# Patient Record
Sex: Male | Born: 1945 | Race: White | Hispanic: No | Marital: Single | Smoking: Never smoker
Health system: Southern US, Community
[De-identification: ages and names within clinical notes are randomized; demographics above are authoritative.]

---

## 2019-02-18 ENCOUNTER — Inpatient Hospital Stay (HOSPITAL_COMMUNITY)
Admission: EM | Admit: 2019-02-18 | Discharge: 2019-03-27 | DRG: 082 | Disposition: E | Payer: Medicare HMO | Attending: General Surgery | Admitting: General Surgery

## 2019-02-18 DIAGNOSIS — J9690 Respiratory failure, unspecified, unspecified whether with hypoxia or hypercapnia: Secondary | ICD-10-CM

## 2019-02-18 DIAGNOSIS — S065XAA Traumatic subdural hemorrhage with loss of consciousness status unknown, initial encounter: Secondary | ICD-10-CM

## 2019-02-18 DIAGNOSIS — S020XXA Fracture of vault of skull, initial encounter for closed fracture: Secondary | ICD-10-CM | POA: Diagnosis not present

## 2019-02-18 DIAGNOSIS — R402123 Coma scale, eyes open, to pain, at hospital admission: Secondary | ICD-10-CM | POA: Diagnosis present

## 2019-02-18 DIAGNOSIS — R64 Cachexia: Secondary | ICD-10-CM | POA: Diagnosis not present

## 2019-02-18 DIAGNOSIS — Z6822 Body mass index (BMI) 22.0-22.9, adult: Secondary | ICD-10-CM

## 2019-02-18 DIAGNOSIS — R402353 Coma scale, best motor response, localizes pain, at hospital admission: Secondary | ICD-10-CM | POA: Diagnosis present

## 2019-02-18 DIAGNOSIS — R000 Tachycardia, unspecified: Secondary | ICD-10-CM | POA: Diagnosis not present

## 2019-02-18 DIAGNOSIS — Z1159 Encounter for screening for other viral diseases: Secondary | ICD-10-CM | POA: Diagnosis not present

## 2019-02-18 DIAGNOSIS — J9601 Acute respiratory failure with hypoxia: Secondary | ICD-10-CM | POA: Diagnosis not present

## 2019-02-18 DIAGNOSIS — B953 Streptococcus pneumoniae as the cause of diseases classified elsewhere: Secondary | ICD-10-CM | POA: Diagnosis not present

## 2019-02-18 DIAGNOSIS — Z515 Encounter for palliative care: Secondary | ICD-10-CM

## 2019-02-18 DIAGNOSIS — I6389 Other cerebral infarction: Secondary | ICD-10-CM | POA: Diagnosis not present

## 2019-02-18 DIAGNOSIS — S066X9A Traumatic subarachnoid hemorrhage with loss of consciousness of unspecified duration, initial encounter: Secondary | ICD-10-CM | POA: Diagnosis not present

## 2019-02-18 DIAGNOSIS — S065X9A Traumatic subdural hemorrhage with loss of consciousness of unspecified duration, initial encounter: Principal | ICD-10-CM | POA: Diagnosis present

## 2019-02-18 DIAGNOSIS — I959 Hypotension, unspecified: Secondary | ICD-10-CM | POA: Diagnosis not present

## 2019-02-18 DIAGNOSIS — R739 Hyperglycemia, unspecified: Secondary | ICD-10-CM | POA: Diagnosis not present

## 2019-02-18 DIAGNOSIS — H1133 Conjunctival hemorrhage, bilateral: Secondary | ICD-10-CM | POA: Diagnosis not present

## 2019-02-18 DIAGNOSIS — L8990 Pressure ulcer of unspecified site, unspecified stage: Secondary | ICD-10-CM

## 2019-02-18 DIAGNOSIS — J13 Pneumonia due to Streptococcus pneumoniae: Secondary | ICD-10-CM | POA: Diagnosis not present

## 2019-02-18 DIAGNOSIS — D62 Acute posthemorrhagic anemia: Secondary | ICD-10-CM | POA: Diagnosis not present

## 2019-02-18 DIAGNOSIS — R451 Restlessness and agitation: Secondary | ICD-10-CM

## 2019-02-18 DIAGNOSIS — G9389 Other specified disorders of brain: Secondary | ICD-10-CM | POA: Diagnosis not present

## 2019-02-18 DIAGNOSIS — R402213 Coma scale, best verbal response, none, at hospital admission: Secondary | ICD-10-CM | POA: Diagnosis not present

## 2019-02-18 DIAGNOSIS — Z66 Do not resuscitate: Secondary | ICD-10-CM

## 2019-02-18 DIAGNOSIS — L89891 Pressure ulcer of other site, stage 1: Secondary | ICD-10-CM | POA: Diagnosis not present

## 2019-02-18 DIAGNOSIS — R0600 Dyspnea, unspecified: Secondary | ICD-10-CM

## 2019-02-18 DIAGNOSIS — J158 Pneumonia due to other specified bacteria: Secondary | ICD-10-CM | POA: Diagnosis not present

## 2019-02-18 DIAGNOSIS — W19XXXA Unspecified fall, initial encounter: Secondary | ICD-10-CM | POA: Diagnosis present

## 2019-02-18 DIAGNOSIS — J9611 Chronic respiratory failure with hypoxia: Secondary | ICD-10-CM

## 2019-02-18 NOTE — ED Notes (Signed)
Paged neurosurgery to dr.rancour
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
## 2019-02-19 ENCOUNTER — Inpatient Hospital Stay (HOSPITAL_COMMUNITY): Payer: Medicare HMO

## 2019-02-19 ENCOUNTER — Emergency Department (HOSPITAL_COMMUNITY): Payer: Medicare HMO

## 2019-02-19 DIAGNOSIS — J9601 Acute respiratory failure with hypoxia: Secondary | ICD-10-CM | POA: Diagnosis present

## 2019-02-19 DIAGNOSIS — S066X9A Traumatic subarachnoid hemorrhage with loss of consciousness of unspecified duration, initial encounter: Secondary | ICD-10-CM | POA: Diagnosis present

## 2019-02-19 DIAGNOSIS — I959 Hypotension, unspecified: Secondary | ICD-10-CM | POA: Diagnosis not present

## 2019-02-19 DIAGNOSIS — Z1159 Encounter for screening for other viral diseases: Secondary | ICD-10-CM | POA: Diagnosis not present

## 2019-02-19 DIAGNOSIS — R64 Cachexia: Secondary | ICD-10-CM | POA: Diagnosis present

## 2019-02-19 DIAGNOSIS — S065XAA Traumatic subdural hemorrhage with loss of consciousness status unknown, initial encounter: Secondary | ICD-10-CM

## 2019-02-19 DIAGNOSIS — Z515 Encounter for palliative care: Secondary | ICD-10-CM | POA: Diagnosis not present

## 2019-02-19 DIAGNOSIS — R0600 Dyspnea, unspecified: Secondary | ICD-10-CM | POA: Diagnosis not present

## 2019-02-19 DIAGNOSIS — I6389 Other cerebral infarction: Secondary | ICD-10-CM | POA: Diagnosis not present

## 2019-02-19 DIAGNOSIS — R402123 Coma scale, eyes open, to pain, at hospital admission: Secondary | ICD-10-CM | POA: Diagnosis present

## 2019-02-19 DIAGNOSIS — Z66 Do not resuscitate: Secondary | ICD-10-CM | POA: Diagnosis not present

## 2019-02-19 DIAGNOSIS — H1133 Conjunctival hemorrhage, bilateral: Secondary | ICD-10-CM | POA: Diagnosis present

## 2019-02-19 DIAGNOSIS — R402353 Coma scale, best motor response, localizes pain, at hospital admission: Secondary | ICD-10-CM | POA: Diagnosis present

## 2019-02-19 DIAGNOSIS — S065X9A Traumatic subdural hemorrhage with loss of consciousness of unspecified duration, initial encounter: Secondary | ICD-10-CM

## 2019-02-19 DIAGNOSIS — R739 Hyperglycemia, unspecified: Secondary | ICD-10-CM | POA: Diagnosis not present

## 2019-02-19 DIAGNOSIS — J9611 Chronic respiratory failure with hypoxia: Secondary | ICD-10-CM | POA: Diagnosis not present

## 2019-02-19 DIAGNOSIS — B953 Streptococcus pneumoniae as the cause of diseases classified elsewhere: Secondary | ICD-10-CM | POA: Diagnosis present

## 2019-02-19 DIAGNOSIS — J158 Pneumonia due to other specified bacteria: Secondary | ICD-10-CM | POA: Diagnosis not present

## 2019-02-19 DIAGNOSIS — R402213 Coma scale, best verbal response, none, at hospital admission: Secondary | ICD-10-CM | POA: Diagnosis present

## 2019-02-19 DIAGNOSIS — D62 Acute posthemorrhagic anemia: Secondary | ICD-10-CM | POA: Diagnosis not present

## 2019-02-19 DIAGNOSIS — W19XXXA Unspecified fall, initial encounter: Secondary | ICD-10-CM | POA: Diagnosis present

## 2019-02-19 DIAGNOSIS — L89891 Pressure ulcer of other site, stage 1: Secondary | ICD-10-CM | POA: Diagnosis not present

## 2019-02-19 DIAGNOSIS — J13 Pneumonia due to Streptococcus pneumoniae: Secondary | ICD-10-CM | POA: Diagnosis not present

## 2019-02-19 DIAGNOSIS — R000 Tachycardia, unspecified: Secondary | ICD-10-CM | POA: Diagnosis not present

## 2019-02-19 DIAGNOSIS — S020XXA Fracture of vault of skull, initial encounter for closed fracture: Secondary | ICD-10-CM | POA: Diagnosis present

## 2019-02-19 DIAGNOSIS — G9389 Other specified disorders of brain: Secondary | ICD-10-CM | POA: Diagnosis present

## 2019-02-19 DIAGNOSIS — Z6822 Body mass index (BMI) 22.0-22.9, adult: Secondary | ICD-10-CM | POA: Diagnosis not present

## 2019-02-19 LAB — POCT I-STAT 7, (LYTES, BLD GAS, ICA,H+H)
Acid-base deficit: 1 mmol/L (ref 0.0–2.0)
Acid-base deficit: 2 mmol/L (ref 0.0–2.0)
Bicarbonate: 22.9 mmol/L (ref 20.0–28.0)
Bicarbonate: 24.3 mmol/L (ref 20.0–28.0)
Calcium, Ion: 1.17 mmol/L (ref 1.15–1.40)
Calcium, Ion: 1.18 mmol/L (ref 1.15–1.40)
HCT: 34 % — ABNORMAL LOW (ref 39.0–52.0)
HCT: 37 % — ABNORMAL LOW (ref 39.0–52.0)
Hemoglobin: 11.6 g/dL — ABNORMAL LOW (ref 13.0–17.0)
Hemoglobin: 12.6 g/dL — ABNORMAL LOW (ref 13.0–17.0)
O2 Saturation: 99 %
O2 Saturation: 99 %
Patient temperature: 96.3
Patient temperature: 97.6
Potassium: 3.6 mmol/L (ref 3.5–5.1)
Potassium: 3.9 mmol/L (ref 3.5–5.1)
Sodium: 138 mmol/L (ref 135–145)
Sodium: 140 mmol/L (ref 135–145)
TCO2: 24 mmol/L (ref 22–32)
TCO2: 26 mmol/L (ref 22–32)
pCO2 arterial: 35.9 mmHg (ref 32.0–48.0)
pCO2 arterial: 39.1 mmHg (ref 32.0–48.0)
pH, Arterial: 7.4 (ref 7.350–7.450)
pH, Arterial: 7.407 (ref 7.350–7.450)
pO2, Arterial: 132 mmHg — ABNORMAL HIGH (ref 83.0–108.0)
pO2, Arterial: 143 mmHg — ABNORMAL HIGH (ref 83.0–108.0)

## 2019-02-19 LAB — CBC WITH DIFFERENTIAL/PLATELET
Abs Immature Granulocytes: 0.12 10*3/uL — ABNORMAL HIGH (ref 0.00–0.07)
Basophils Absolute: 0 10*3/uL (ref 0.0–0.1)
Basophils Relative: 0 %
Eosinophils Absolute: 0 10*3/uL (ref 0.0–0.5)
Eosinophils Relative: 0 %
HCT: 40.1 % (ref 39.0–52.0)
Hemoglobin: 13.2 g/dL (ref 13.0–17.0)
Immature Granulocytes: 1 %
Lymphocytes Relative: 7 %
Lymphs Abs: 1.2 10*3/uL (ref 0.7–4.0)
MCH: 28.4 pg (ref 26.0–34.0)
MCHC: 32.9 g/dL (ref 30.0–36.0)
MCV: 86.4 fL (ref 80.0–100.0)
Monocytes Absolute: 1 10*3/uL (ref 0.1–1.0)
Monocytes Relative: 6 %
Neutro Abs: 15 10*3/uL — ABNORMAL HIGH (ref 1.7–7.7)
Neutrophils Relative %: 86 %
Platelets: 214 10*3/uL (ref 150–400)
RBC: 4.64 MIL/uL (ref 4.22–5.81)
RDW: 13 % (ref 11.5–15.5)
WBC: 17.2 10*3/uL — ABNORMAL HIGH (ref 4.0–10.5)
nRBC: 0 % (ref 0.0–0.2)

## 2019-02-19 LAB — COMPREHENSIVE METABOLIC PANEL
ALT: 16 U/L (ref 0–44)
AST: 25 U/L (ref 15–41)
Albumin: 3.6 g/dL (ref 3.5–5.0)
Alkaline Phosphatase: 60 U/L (ref 38–126)
Anion gap: 11 (ref 5–15)
BUN: 13 mg/dL (ref 8–23)
CO2: 22 mmol/L (ref 22–32)
Calcium: 8.6 mg/dL — ABNORMAL LOW (ref 8.9–10.3)
Chloride: 106 mmol/L (ref 98–111)
Creatinine, Ser: 0.82 mg/dL (ref 0.61–1.24)
GFR calc Af Amer: >60 mL/min (ref >60)
GFR calc non Af Amer: >60 mL/min (ref >60)
Glucose, Bld: 134 mg/dL — ABNORMAL HIGH (ref 70–99)
Potassium: 3.6 mmol/L (ref 3.5–5.1)
Sodium: 139 mmol/L (ref 135–145)
Total Bilirubin: 0.6 mg/dL (ref 0.3–1.2)
Total Protein: 6.7 g/dL (ref 6.5–8.1)

## 2019-02-19 LAB — PROTIME-INR
INR: 1 (ref 0.8–1.2)
Prothrombin Time: 13.1 seconds (ref 11.4–15.2)

## 2019-02-19 LAB — SARS CORONAVIRUS 2 BY RT PCR (HOSPITAL ORDER, PERFORMED IN ~~LOC~~ HOSPITAL LAB): SARS Coronavirus 2: NEGATIVE

## 2019-02-19 LAB — TRIGLYCERIDES
Triglycerides: 129 mg/dL (ref <150)
Triglycerides: 78 mg/dL (ref <150)

## 2019-02-19 LAB — TYPE AND SCREEN
ABO/RH(D): A POS
Antibody Screen: NEGATIVE

## 2019-02-19 LAB — ABO/RH: ABO/RH(D): A POS

## 2019-02-19 LAB — MRSA PCR SCREENING: MRSA by PCR: NEGATIVE

## 2019-02-19 MED ORDER — FENTANYL 2500MCG IN NS 250ML (10MCG/ML) PREMIX INFUSION
25.0000 ug/h | INTRAVENOUS | Status: DC
  Administered 2019-02-19: 100 ug/h via INTRAVENOUS
  Administered 2019-02-19: 200 ug/h via INTRAVENOUS
  Administered 2019-02-21: 75 ug/h via INTRAVENOUS
  Administered 2019-02-22: 23:00:00 50 ug/h via INTRAVENOUS
  Administered 2019-02-24: 100 ug/h via INTRAVENOUS
  Administered 2019-02-26: 75 ug/h via INTRAVENOUS
  Administered 2019-02-27: 100 ug/h via INTRAVENOUS
  Filled 2019-02-19 (×8): qty 250

## 2019-02-19 MED ORDER — LEVETIRACETAM IN NACL 500 MG/100ML IV SOLN
500.0000 mg | Freq: Two times a day (BID) | INTRAVENOUS | Status: AC
  Administered 2019-02-19 – 2019-02-26 (×16): 500 mg via INTRAVENOUS
  Filled 2019-02-19 (×16): qty 100

## 2019-02-19 MED ORDER — PROPOFOL 1000 MG/100ML IV EMUL: 0.0000 ug/kg/min | INTRAVENOUS | Status: DC

## 2019-02-19 MED ORDER — DOCUSATE SODIUM 50 MG/5ML PO LIQD
100.0000 mg | Freq: Two times a day (BID) | ORAL | Status: DC | PRN
  Administered 2019-02-20 – 2019-02-24 (×5): 100 mg
  Filled 2019-02-19 (×5): qty 10

## 2019-02-19 MED ORDER — POTASSIUM CHLORIDE IN NACL 20-0.9 MEQ/L-% IV SOLN
INTRAVENOUS | Status: DC
  Administered 2019-02-19 – 2019-02-28 (×21): via INTRAVENOUS
  Filled 2019-02-19 (×20): qty 1000

## 2019-02-19 MED ORDER — FENTANYL BOLUS VIA INFUSION
25.0000 ug | INTRAVENOUS | Status: DC | PRN
  Administered 2019-02-22 – 2019-02-24 (×5): 25 ug via INTRAVENOUS
  Filled 2019-02-19: qty 25

## 2019-02-19 MED ORDER — MIDAZOLAM 50MG/50ML (1MG/ML) PREMIX INFUSION: 0.0000 mg/h | INTRAVENOUS | Status: DC

## 2019-02-19 MED ORDER — ORAL CARE MOUTH RINSE
15.0000 mL | OROMUCOSAL | Status: DC
  Administered 2019-02-19 – 2019-03-02 (×110): 15 mL via OROMUCOSAL

## 2019-02-19 MED ORDER — MIDAZOLAM BOLUS VIA INFUSION
1.0000 mg | INTRAVENOUS | Status: DC | PRN
  Filled 2019-02-19: qty 2

## 2019-02-19 MED ORDER — METOPROLOL TARTRATE 5 MG/5ML IV SOLN
5.0000 mg | Freq: Four times a day (QID) | INTRAVENOUS | Status: AC | PRN
  Administered 2019-02-20 – 2019-02-23 (×4): 5 mg via INTRAVENOUS
  Filled 2019-02-19 (×4): qty 5

## 2019-02-19 MED ORDER — PANTOPRAZOLE SODIUM 40 MG IV SOLR
40.0000 mg | INTRAVENOUS | Status: DC
  Administered 2019-02-19 – 2019-02-25 (×7): 40 mg via INTRAVENOUS
  Filled 2019-02-19 (×7): qty 40

## 2019-02-19 MED ORDER — FENTANYL CITRATE (PF) 100 MCG/2ML IJ SOLN: 25.0000 ug | Freq: Once | INTRAMUSCULAR | Status: DC

## 2019-02-19 MED ORDER — CHLORHEXIDINE GLUCONATE 0.12% ORAL RINSE (MEDLINE KIT)
15.0000 mL | Freq: Two times a day (BID) | OROMUCOSAL | Status: DC
  Administered 2019-02-19 – 2019-03-01 (×22): 15 mL via OROMUCOSAL

## 2019-02-19 MED ORDER — PROPOFOL 1000 MG/100ML IV EMUL
0.0000 ug/kg/min | INTRAVENOUS | Status: DC
  Administered 2019-02-19: 10 ug/kg/min via INTRAVENOUS
  Administered 2019-02-20: 25 ug/kg/min via INTRAVENOUS
  Administered 2019-02-20 – 2019-02-21 (×2): 10 ug/kg/min via INTRAVENOUS
  Administered 2019-02-22: 5 ug/kg/min via INTRAVENOUS
  Administered 2019-02-23: 10 ug/kg/min via INTRAVENOUS
  Administered 2019-02-24: 5 ug/kg/min via INTRAVENOUS
  Administered 2019-02-25: 20 ug/kg/min via INTRAVENOUS
  Administered 2019-02-25: 10 ug/kg/min via INTRAVENOUS
  Administered 2019-02-26: 20 ug/kg/min via INTRAVENOUS
  Administered 2019-02-26: 10 ug/kg/min via INTRAVENOUS
  Administered 2019-02-27: 20 ug/kg/min via INTRAVENOUS
  Administered 2019-02-27 – 2019-02-28 (×2): 10 ug/kg/min via INTRAVENOUS
  Filled 2019-02-19 (×14): qty 100
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
## 2019-02-19 NOTE — Progress Notes (Signed)
RT NOTE:  Pt transported to [facility]N by Lowella Bandy, RT. Report given to [facility], RT by this RT.

## 2019-02-19 NOTE — Progress Notes (Signed)
Pt arrived on unit with clothing and a wallet. Wallet contained ID cards/ insurance cards/ and $189. Pt belongs sent down to Security- yellow slip in pt chart.
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
## 2019-02-20 ENCOUNTER — Inpatient Hospital Stay (HOSPITAL_COMMUNITY): Payer: Medicare HMO

## 2019-02-20 LAB — CBC
HCT: 36 % — ABNORMAL LOW (ref 39.0–52.0)
Hemoglobin: 11.6 g/dL — ABNORMAL LOW (ref 13.0–17.0)
MCH: 28.4 pg (ref 26.0–34.0)
MCHC: 32.2 g/dL (ref 30.0–36.0)
MCV: 88.2 fL (ref 80.0–100.0)
Platelets: 174 10*3/uL (ref 150–400)
RBC: 4.08 MIL/uL — ABNORMAL LOW (ref 4.22–5.81)
RDW: 13.5 % (ref 11.5–15.5)
WBC: 11.5 10*3/uL — ABNORMAL HIGH (ref 4.0–10.5)
nRBC: 0 % (ref 0.0–0.2)

## 2019-02-20 LAB — GLUCOSE, CAPILLARY
Glucose-Capillary: 112 mg/dL — ABNORMAL HIGH (ref 70–99)
Glucose-Capillary: 151 mg/dL — ABNORMAL HIGH (ref 70–99)
Glucose-Capillary: 162 mg/dL — ABNORMAL HIGH (ref 70–99)
Glucose-Capillary: 169 mg/dL — ABNORMAL HIGH (ref 70–99)
Glucose-Capillary: 90 mg/dL (ref 70–99)

## 2019-02-20 LAB — BASIC METABOLIC PANEL
Anion gap: 11 (ref 5–15)
BUN: 14 mg/dL (ref 8–23)
CO2: 18 mmol/L — ABNORMAL LOW (ref 22–32)
Calcium: 7.9 mg/dL — ABNORMAL LOW (ref 8.9–10.3)
Chloride: 111 mmol/L (ref 98–111)
Creatinine, Ser: 0.69 mg/dL (ref 0.61–1.24)
GFR calc Af Amer: >60 mL/min (ref >60)
GFR calc non Af Amer: >60 mL/min (ref >60)
Glucose, Bld: 92 mg/dL (ref 70–99)
Potassium: 4 mmol/L (ref 3.5–5.1)
Sodium: 140 mmol/L (ref 135–145)

## 2019-02-20 LAB — MAGNESIUM
Magnesium: 2 mg/dL (ref 1.7–2.4)
Magnesium: 2 mg/dL (ref 1.7–2.4)

## 2019-02-20 LAB — PHOSPHORUS
Phosphorus: 2.5 mg/dL (ref 2.5–4.6)
Phosphorus: 1.7 mg/dL — ABNORMAL LOW (ref 2.5–4.6)

## 2019-02-20 LAB — TRIGLYCERIDES: Triglycerides: 67 mg/dL (ref <150)

## 2019-02-20 MED ORDER — VITAL HIGH PROTEIN PO LIQD: 1000.0000 mL | ORAL | Status: DC

## 2019-02-20 MED ORDER — PRO-STAT SUGAR FREE PO LIQD
30.0000 mL | Freq: Two times a day (BID) | ORAL | Status: DC
  Administered 2019-02-20 – 2019-02-28 (×17): 30 mL
  Filled 2019-02-20 (×17): qty 30

## 2019-02-20 MED ORDER — PIVOT 1.5 CAL PO LIQD
1000.0000 mL | ORAL | Status: DC
  Administered 2019-02-20 – 2019-02-28 (×8): 1000 mL
  Filled 2019-02-20 (×2): qty 1000

## 2019-02-20 MED ORDER — CHLORHEXIDINE GLUCONATE CLOTH 2 % EX PADS
6.0000 | MEDICATED_PAD | Freq: Every day | CUTANEOUS | Status: DC
  Administered 2019-02-21 – 2019-03-05 (×12): 6 via TOPICAL

## 2019-02-20 MED ORDER — SELENIUM 200 MCG PO TABS
200.0000 ug | ORAL_TABLET | Freq: Every day | ORAL | Status: AC
  Administered 2019-02-20 – 2019-02-26 (×7): 200 ug
  Filled 2019-02-20 (×7): qty 1

## 2019-02-20 MED ORDER — VITAMIN C 500 MG PO TABS
1000.0000 mg | ORAL_TABLET | Freq: Three times a day (TID) | ORAL | Status: AC
  Administered 2019-02-20 – 2019-02-26 (×21): 1000 mg
  Filled 2019-02-20 (×21): qty 2

## 2019-02-20 NOTE — Progress Notes (Signed)
After turning pt, SpO2 dropped to 83%, HR 215bpm. OG came out when pt was agitated and moving around in bed. Placed new OG. Dr. Janee Morn called and at bedside to evaluate pt. Increased fentanyl and prop gtt. See assoc orders.

## 2019-02-20 NOTE — Progress Notes (Addendum)
Patient ID: Nektarios Penninger, male   DOB: 20-Jun-1946, 73 y.o.   MRN: [id number] Follow up - Trauma Critical Care  Patient Details:    Dontra Stoneking is an 73 y.o. male.  Lines/tubes : Airway 7.5 mm (Active)  Secured at (cm) 24 cm 02/20/2019  3:14 AM  Measured From Lips 02/20/2019  3:14 AM  Secured Location Center 02/20/2019  3:14 AM  Secured By [employer] 02/20/2019  3:14 AM  Tube Holder Repositioned Yes 02/20/2019  3:14 AM  Cuff Pressure (cm H2O) 28 cm H2O 02/19/2019  7:05 PM  Site Condition Dry 02/19/2019  3:35 PM     NG/OG Tube Orogastric Left mouth (Active)  Cm Marking at Nare/Corner of Mouth (if applicable) 65 cm 02/19/2019  8:00 PM  Site Assessment Clean;Dry;Intact 02/19/2019  8:00 PM  Ongoing Placement Verification No change in cm markings or external length of tube from initial placement;No change in respiratory status;No acute changes, not attributed to clinical condition 02/19/2019  8:00 PM  Status Clamped 02/19/2019  8:00 PM     External Urinary Catheter (Active)  Collection Container Standard drainage bag 02/19/2019  8:00 PM  Securement Method Other (Comment) 02/19/2019  8:00 AM  Output (mL) 0 mL 02/19/2019 10:00 PM    Microbiology/Sepsis markers: Results for orders placed or performed during the hospital encounter of 03-09-19  SARS Coronavirus 2 (CEPHEID- Performed in [facility] hospital lab), Hosp Order     Status: None   Collection Time: 09-Mar-2019 11:57 PM  Result Value Ref Range Status   SARS Coronavirus 2 NEGATIVE NEGATIVE Final    Comment: (NOTE) If result is NEGATIVE SARS-CoV-2 target nucleic acids are NOT DETECTED. The SARS-CoV-2 RNA is generally detectable in upper and lower  respiratory specimens during the acute phase of infection. The lowest  concentration of SARS-CoV-2 viral copies this assay can detect is 250  copies / mL. A negative result does not preclude SARS-CoV-2 infection  and should not be used as the sole basis for treatment or other   patient management decisions.  A negative result may occur with  improper specimen collection / handling, submission of specimen other  than nasopharyngeal swab, presence of viral mutation(s) within the  areas targeted by this assay, and inadequate number of viral copies  (<250 copies / mL). A negative result must be combined with clinical  observations, patient history, and epidemiological information. If result is POSITIVE SARS-CoV-2 target nucleic acids are DETECTED. The SARS-CoV-2 RNA is generally detectable in upper and lower  respiratory specimens dur ing the acute phase of infection.  Positive  results are indicative of active infection with SARS-CoV-2.  Clinical  correlation with patient history and other diagnostic information is  necessary to determine patient infection status.  Positive results do  not rule out bacterial infection or co-infection with other viruses. If result is PRESUMPTIVE POSTIVE SARS-CoV-2 nucleic acids MAY BE PRESENT.   A presumptive positive result was obtained on the submitted specimen  and confirmed on repeat testing.  While 2019 novel coronavirus  (SARS-CoV-2) nucleic acids may be present in the submitted sample  additional confirmatory testing may be necessary for epidemiological  and / or clinical management purposes  to differentiate between  SARS-CoV-2 and other Sarbecovirus currently known to infect humans.  If clinically indicated additional testing with an alternate test  methodology [phone] is advised. The SARS-CoV-2 RNA is generally  detectable in upper and lower respiratory sp ecimens during the acute  phase of infection. The expected result is Negative.  Fact Sheet for Patients:  BoilerBrush.com.cy Fact Sheet for Healthcare Providers: https://pope.com/ This test is not yet approved or cleared by the [place] FDA and has been authorized for detection and/or diagnosis of SARS-CoV-2  by FDA under an Emergency Use Authorization (EUA).  This EUA will remain in effect (meaning this test can be used) for the duration of the COVID-19 declaration under Section 564(b)(1) of the Act, 21 U.S.C. section 360bbb-3(b)(1), unless the authorization is terminated or revoked sooner. Performed at [facility], [address], [place], [place] [address]   MRSA PCR Screening     Status: None   Collection Time: 02/19/19  1:19 AM  Result Value Ref Range Status   MRSA by PCR NEGATIVE NEGATIVE Final    Comment:        The GeneXpert MRSA Assay (FDA approved for NASAL specimens only), is one component of a comprehensive MRSA colonization surveillance program. It is not intended to diagnose MRSA infection nor to guide or monitor treatment for MRSA infections. Performed at [facility], [address], [place], [place] [address]     Anti-infectives:  Anti-infectives (From admission, onward)   None      Best Practice/Protocols:  VTE Prophylaxis: Mechanical Continous Sedation  Consults: Treatment Team:  Jadene Pierini, MD   Subjective:    Overnight Issues:   Objective:  Vital signs for last 24 hours: Temp:  [98.1 F (36.7 C)-98.9 F (37.2 C)] 98.9 F (37.2 C) (05/27 0400) Pulse Rate:  [49-88] 54 (05/27 0700) Resp:  [14-32] 24 (05/27 0700) BP: (91-154)/(46-80) 121/53 (05/27 0700) SpO2:  [96 %-100 %] 99 % (05/27 0700) FiO2 (%):  [35 %-40 %] 40 % (05/27 0700)  Hemodynamic parameters for last 24 hours:    Intake/Output from previous day: 05/26 0701 - 05/27 0700 In: 2972.4 [I.V.:2761.4; IV Piggyback:211] Out: 850 [Urine:850]  Intake/Output this shift: No intake/output data recorded.  Vent settings for last 24 hours: Vent Mode: PRVC FiO2 (%):  [35 %-40 %] 40 % Set Rate:  [14 bmp] 14 bmp Vt Set:  [600 mL] 600 mL PEEP:  [5 cmH20] 5 cmH20 Plateau Pressure:  [15 cmH20-16 cmH20] 16 cmH20  Physical Exam:  General: on vent Neuro: PERL, arouses  and MAE but not F/C HEENT/Neck: ETT and collar Resp: clear to auscultation bilaterally CVS: RRR GI: soft, NT Extremities: no edema  Results for orders placed or performed during the hospital encounter of 2019/02/20 (from the past 24 hour(s))  I-STAT 7, (LYTES, BLD GAS, ICA, H+H)     Status: Abnormal   Collection Time: 02/19/19  8:24 AM  Result Value Ref Range   pH, Arterial 7.400 7.350 - 7.450   pCO2 arterial 39.1 32.0 - 48.0 mmHg   pO2, Arterial 132.0 (H) 83.0 - 108.0 mmHg   Bicarbonate 24.3 20.0 - 28.0 mmol/L   TCO2 26 22 - 32 mmol/L   O2 Saturation 99.0 %   Acid-base deficit 1.0 0.0 - 2.0 mmol/L   Sodium 140 135 - 145 mmol/L   Potassium 3.9 3.5 - 5.1 mmol/L   Calcium, Ion 1.17 1.15 - 1.40 mmol/L   HCT 34.0 (L) 39.0 - 52.0 %   Hemoglobin 11.6 (L) 13.0 - 17.0 g/dL   Patient temperature 09.8 F    Collection site RADIAL, ALLEN'S TEST ACCEPTABLE    Sample type ARTERIAL   Triglycerides     Status: None   Collection Time: 02/20/19  4:31 AM  Result Value Ref Range   Triglycerides  67 <150 mg/dL  CBC     Status: Abnormal   Collection Time: 02/20/19  4:31 AM  Result Value Ref Range   WBC 11.5 (H) 4.0 - 10.5 K/uL   RBC 4.08 (L) 4.22 - 5.81 MIL/uL   Hemoglobin 11.6 (L) 13.0 - 17.0 g/dL   HCT 29.536.0 (L) 62.139.0 - 30.852.0 %   MCV 88.2 80.0 - 100.0 fL   MCH 28.4 26.0 - 34.0 pg   MCHC 32.2 30.0 - 36.0 g/dL   RDW 65.713.5 84.611.5 - 96.215.5 %   Platelets 174 150 - 400 K/uL   nRBC 0.0 0.0 - 0.2 %  Basic metabolic panel     Status: Abnormal   Collection Time: 02/20/19  4:31 AM  Result Value Ref Range   Sodium 140 135 - 145 mmol/L   Potassium 4.0 3.5 - 5.1 mmol/L   Chloride 111 98 - 111 mmol/L   CO2 18 (L) 22 - 32 mmol/L   Glucose, Bld 92 70 - 99 mg/dL   BUN 14 8 - 23 mg/dL   Creatinine, Ser 9.520.69 0.61 - 1.24 mg/dL   Calcium 7.9 (L) 8.9 - 10.3 mg/dL   GFR calc non Af Amer >60 >60 mL/min   GFR calc Af Amer >60 >60 mL/min   Anion gap 11 5 - 15    Assessment & Plan: Present on Admission: . Subdural  hematoma (HCC)    LOS: 1 day   Additional comments:I reviewed the patient's new clinical lab test results. and F/U CT H yesterday Found down TBI/B SDH/SAH/B temporal ICC/parietal skull FX/pneumocephalus - per Dr. Maurice Smallstergard, F/U CT head yesterday was worse including L temporal lobe infarct, Keppra 500mg  Q12 Acute hypoxic ventilator dependent respiratory failure - gas exchange has been OK, start weaning. Will not extubate until MS better. [place] collar for now FEN - start TF, Na OK VTE - PAS Dispo - ICU I will call his daughter this AM Critical Care Total Time*: 4439 Minutes  Violeta GelinasBurke Harace Mccluney, MD, MPH, FACS Trauma & General Surgery: [phone]  02/20/2019  *Care during the described time interval was provided by me. I have reviewed this patient's available data, including medical history, events of note, physical examination and test results as part of my evaluation.
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
## 2019-02-20 NOTE — Progress Notes (Signed)
Initial Nutrition Assessment  DOCUMENTATION CODES:   Not applicable  INTERVENTION:   Pivot 1.5 @ 45 ml/hr 30 ml Prostat BID  Provides: 1640 kcal, 120 grams protein, and 728 ml free water.  TF regimen and propofol at current rate providing 1699 total kcal/day (98 % of kcal needs)   NUTRITION DIAGNOSIS:   Increased nutrient needs related to (TBI) as evidenced by estimated needs.  GOAL:   Patient will meet greater than or equal to 90% of their needs  MONITOR:   Vent status, TF tolerance  REASON FOR ASSESSMENT:   Consult, Ventilator Enteral/tube feeding initiation and management  ASSESSMENT:   Pt found down with TBI/B SDH/SAH/B temporal ICC/parietal skull fx, pneumocephalus.    Per MD head CT worse  Patient is currently intubated on ventilator support MV: 9.5 L/min Temp (24hrs), Avg:98.3 F (36.8 C), Min:97.7 F (36.5 C), Max:98.9 F (37.2 C)  Propofol: 2.25 ml/hr (5 mcg) = 59 kcal  Medications reviewed and include: selenium, 1000 mg vitamin C every 8 hours NS with 20 mEq KCl/L @ 100 ml/hr Labs reviewed Pt is positive 2 L since admission   NUTRITION - FOCUSED PHYSICAL EXAM:  Deferred   Diet Order:   Diet Order            Diet NPO time specified  Diet effective now              EDUCATION NEEDS:   No education needs have been identified at this time  Skin:  Skin Assessment: Reviewed RN Assessment  Last BM:  unknown  Height:   Ht Readings from Last 1 Encounters:  02/19/19 5\' 9"  (1.753 m)    Weight:   Wt Readings from Last 1 Encounters:  02/19/19 75 kg    Ideal Body Weight:  72.7 kg  BMI:  Body mass index is 24.42 kg/m.  Estimated Nutritional Needs:   Kcal:  1731  Protein:  112-125 grams  Fluid:  > 1.7 L/day  Kendell Bane RD, LDN, CNSC [phone] Pager [phone] After Hours Pager

## 2019-02-20 NOTE — Progress Notes (Signed)
Patient ID: Francis Mendoza, male   DOB: Mar 14, 1946, 73 y.o.   MRN: [id number] I spoke with Alanda Amass on the phone. Dellas lives with her and she is his deceased son in law's wife. She is his closest family. I updated he ron his condition and the plan of care.  Violeta Gelinas, MD, MPH, FACS Trauma & General Surgery: [phone]
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
## 2019-02-21 ENCOUNTER — Inpatient Hospital Stay (HOSPITAL_COMMUNITY): Payer: Medicare HMO

## 2019-02-21 LAB — CBC
HCT: 34.8 % — ABNORMAL LOW (ref 39.0–52.0)
Hemoglobin: 11.3 g/dL — ABNORMAL LOW (ref 13.0–17.0)
MCH: 28.4 pg (ref 26.0–34.0)
MCHC: 32.5 g/dL (ref 30.0–36.0)
MCV: 87.4 fL (ref 80.0–100.0)
Platelets: 191 10*3/uL (ref 150–400)
RBC: 3.98 MIL/uL — ABNORMAL LOW (ref 4.22–5.81)
RDW: 13.7 % (ref 11.5–15.5)
WBC: 15.1 10*3/uL — ABNORMAL HIGH (ref 4.0–10.5)
nRBC: 0 % (ref 0.0–0.2)

## 2019-02-21 LAB — GLUCOSE, CAPILLARY
Glucose-Capillary: 166 mg/dL — ABNORMAL HIGH (ref 70–99)
Glucose-Capillary: 148 mg/dL — ABNORMAL HIGH (ref 70–99)
Glucose-Capillary: 145 mg/dL — ABNORMAL HIGH (ref 70–99)
Glucose-Capillary: 159 mg/dL — ABNORMAL HIGH (ref 70–99)
Glucose-Capillary: 118 mg/dL — ABNORMAL HIGH (ref 70–99)
Glucose-Capillary: 132 mg/dL — ABNORMAL HIGH (ref 70–99)

## 2019-02-21 LAB — BASIC METABOLIC PANEL
Anion gap: 4 — ABNORMAL LOW (ref 5–15)
BUN: 24 mg/dL — ABNORMAL HIGH (ref 8–23)
CO2: 26 mmol/L (ref 22–32)
Calcium: 7.8 mg/dL — ABNORMAL LOW (ref 8.9–10.3)
Chloride: 112 mmol/L — ABNORMAL HIGH (ref 98–111)
Creatinine, Ser: 0.72 mg/dL (ref 0.61–1.24)
GFR calc Af Amer: >60 mL/min (ref >60)
GFR calc non Af Amer: >60 mL/min (ref >60)
Glucose, Bld: 165 mg/dL — ABNORMAL HIGH (ref 70–99)
Potassium: 4.1 mmol/L (ref 3.5–5.1)
Sodium: 142 mmol/L (ref 135–145)

## 2019-02-21 LAB — MAGNESIUM
Magnesium: 2.2 mg/dL (ref 1.7–2.4)
Magnesium: 2 mg/dL (ref 1.7–2.4)

## 2019-02-21 LAB — PHOSPHORUS
Phosphorus: 1 mg/dL — CL (ref 2.5–4.6)
Phosphorus: 1.5 mg/dL — ABNORMAL LOW (ref 2.5–4.6)

## 2019-02-21 LAB — TRIGLYCERIDES: Triglycerides: 76 mg/dL (ref <150)

## 2019-02-21 MED ORDER — INSULIN ASPART 100 UNIT/ML ~~LOC~~ SOLN
0.0000 [IU] | SUBCUTANEOUS | Status: DC
  Administered 2019-02-21: 2 [IU] via SUBCUTANEOUS
  Administered 2019-02-21: 3 [IU] via SUBCUTANEOUS
  Administered 2019-02-21 – 2019-02-24 (×16): 2 [IU] via SUBCUTANEOUS
  Administered 2019-02-24: 16:00:00 3 [IU] via SUBCUTANEOUS
  Administered 2019-02-24 – 2019-02-25 (×6): 2 [IU] via SUBCUTANEOUS
  Administered 2019-02-26: 3 [IU] via SUBCUTANEOUS
  Administered 2019-02-26 – 2019-03-04 (×16): 2 [IU] via SUBCUTANEOUS

## 2019-02-21 MED ORDER — SODIUM PHOSPHATES 45 MMOLE/15ML IV SOLN
20.0000 mmol | Freq: Once | INTRAVENOUS | Status: AC
  Administered 2019-02-21: 12:00:00 20 mmol via INTRAVENOUS
  Filled 2019-02-21: qty 6.67

## 2019-02-21 NOTE — Progress Notes (Signed)
Patient ID: Francis HousemanRalph Mendoza, male   DOB: [birth year], 73 y.o.   MRN: [id number] Follow up - Trauma Critical Care  Patient Details:    Francis HousemanRalph Mendoza is an 73 y.o. male.  Lines/tubes : Airway 7.5 mm (Active)  Secured at (cm) 24 cm 02/21/2019  4:10 AM  Measured From Lips 02/21/2019  4:10 AM  Secured Location Left 02/21/2019 12:15 AM  Secured By [employer] Tube Holder 02/21/2019  4:10 AM  Tube Holder Repositioned Yes 02/21/2019  4:10 AM  Cuff Pressure (cm H2O) 30 cm H2O 02/21/2019 12:15 AM  Site Condition Dry 02/21/2019  4:10 AM     NG/OG Tube Orogastric Center mouth Xray (Active)  External Length of Tube (cm) - (if applicable) 41 cm 02/21/2019 12:00 AM  Site Assessment Clean;Dry;Intact 02/20/2019  8:00 PM  Ongoing Placement Verification No change in cm markings or external length of tube from initial placement;No change in respiratory status;No acute changes, not attributed to clinical condition 02/20/2019  8:00 PM  Status Infusing tube feed 02/21/2019 12:00 AM     Urethral Catheter Tera MaterM Mead, RN Straight-tip 16 Fr. (Active)  Indication for Insertion or Continuance of Catheter Acute urinary retention (I&O Cath for 24 hrs prior to catheter insertion- Inpatient Only) 02/20/2019  9:00 PM  Site Assessment Clean;Dry;Intact 02/20/2019  9:00 PM  Catheter Maintenance Bag below level of bladder;Catheter secured;Drainage bag/tubing not touching floor;Insertion date on drainage bag;No dependent loops;Seal intact 02/20/2019  9:00 PM  Collection Container Standard drainage bag 02/20/2019  9:00 PM  Securement Method Securing device (Describe) 02/20/2019  9:00 PM  Urinary Catheter Interventions Unclamped 02/20/2019  9:00 PM  Output (mL) 350 mL 02/21/2019  7:00 AM    Microbiology/Sepsis markers: Results for orders placed or performed during the hospital encounter of 02/17/2019  SARS Coronavirus 2 (CEPHEID- Performed [facility]), Hosp Order     Status: None   Collection Time: 02/17/2019 11:57 PM  Result  Value Ref Range Status   SARS Coronavirus 2 NEGATIVE NEGATIVE Final    Comment: (NOTE) If result is NEGATIVE SARS-CoV-2 target nucleic acids are NOT DETECTED. The SARS-CoV-2 RNA is generally detectable in upper and lower  respiratory specimens during the acute phase of infection. The lowest  concentration of SARS-CoV-2 viral copies this assay can detect is 250  copies / mL. A negative result does not preclude SARS-CoV-2 infection  and should not be used as the sole basis for treatment or other  patient management decisions.  A negative result may occur with  improper specimen collection / handling, submission of specimen other  than nasopharyngeal swab, presence of viral mutation(s) within the  areas targeted by this assay, and inadequate number of viral copies  (<250 copies / mL). A negative result must be combined with clinical  observations, patient history, and epidemiological information. If result is POSITIVE SARS-CoV-2 target nucleic acids are DETECTED. The SARS-CoV-2 RNA is generally detectable in upper and lower  respiratory specimens dur ing the acute phase of infection.  Positive  results are indicative of active infection with SARS-CoV-2.  Clinical  correlation with patient history and other diagnostic information is  necessary to determine patient infection status.  Positive results do  not rule out bacterial infection or co-infection with other viruses. If result is PRESUMPTIVE POSTIVE SARS-CoV-2 nucleic acids MAY BE PRESENT.   A presumptive positive result was obtained on the submitted specimen  and confirmed on repeat testing.  While 2019 novel coronavirus  (SARS-CoV-2) nucleic acids may be present in the  submitted sample  additional confirmatory testing may be necessary for epidemiological  and / or clinical management purposes  to differentiate between  SARS-CoV-2 and other Sarbecovirus currently known to infect humans.  If clinically indicated additional testing  with an alternate test  methodology [phone]) is advised. The SARS-CoV-2 RNA is generally  detectable in upper and lower respiratory sp ecimens during the acute  phase of infection. The expected result is Negative. Fact Sheet for Patients:  BoilerBrush.com.cy Fact Sheet for Healthcare Providers: https://pope.com/ This test is not yet approved or cleared by the [place] FDA and has been authorized for detection and/or diagnosis of SARS-CoV-2 by FDA under an Emergency Use Authorization (EUA).  This EUA will remain in effect (meaning this test can be used) for the duration of the COVID-19 declaration under Section 564(b)(1) of the Act, 21 U.S.C. section 360bbb-3(b)(1), unless the authorization is terminated or revoked sooner. Performed at [facility], [address], [place], [place] [address]   MRSA PCR Screening     Status: None   Collection Time: 02/19/19  1:19 AM  Result Value Ref Range Status   MRSA by PCR NEGATIVE NEGATIVE Final    Comment:        The GeneXpert MRSA Assay (FDA approved for NASAL specimens only), is one component of a comprehensive MRSA colonization surveillance program. It is not intended to diagnose MRSA infection nor to guide or monitor treatment for MRSA infections. Performed at [facility], [address] [place], [place] [address]     Anti-infectives:  Anti-infectives (From admission, onward)   None      Best Practice/Protocols:  VTE Prophylaxis: Mechanical Continous Sedation  Consults: Treatment Team:  Jadene Pierini, MD   Subjective:    Overnight Issues:   Objective:  Vital signs for last 24 hours: Temp:  [97.7 F (36.5 C)-99.4 F (37.4 C)] 98.9 F (37.2 C) (05/28 0400) Pulse Rate:  [62-137] 68 (05/28 0700) Resp:  [16-24] 17 (05/28 0700) BP: (87-154)/(43-65) 111/54 (05/28 0700) SpO2:  [91 %-99 %] 99 % (05/28 0700) FiO2 (%):  [40 %-80 %] 40 % (05/28  0410)  Hemodynamic parameters for last 24 hours:    Intake/Output from previous day: 05/27 0701 - 05/28 0700 In: 3336.2 [I.V.:2596.2; NG/GT:540; IV Piggyback:200] Out: 1005 [Urine:1005]  Intake/Output this shift: No intake/output data recorded.  Vent settings for last 24 hours: Vent Mode: PRVC FiO2 (%):  [40 %-80 %] 40 % Set Rate:  [14 bmp] 14 bmp Vt Set:  [600 mL] 600 mL PEEP:  [5 cmH20-8 cmH20] 8 cmH20 Plateau Pressure:  [11 cmH20-18 cmH20] 18 cmH20  Physical Exam:  General: on vent Neuro: PERL, WD from pain, sedated HEENT/Neck: ETT and collar Resp: clear to auscultation bilaterally CVS: RRR GI: soft, NT, ND Extremities: no edema, no erythema, pulses WNL  Results for orders placed or performed during the hospital encounter of 02/15/2019 (from the past 24 hour(s))  Glucose, capillary     Status: None   Collection Time: 02/20/19  7:38 AM  Result Value Ref Range   Glucose-Capillary 90 70 - 99 mg/dL  Glucose, capillary     Status: Abnormal   Collection Time: 02/20/19 11:04 AM  Result Value Ref Range   Glucose-Capillary 112 (H) 70 - 99 mg/dL  Glucose, capillary     Status: Abnormal   Collection Time: 02/20/19  3:44 PM  Result Value Ref Range   Glucose-Capillary 162 (H) 70 - 99 mg/dL  Magnesium  Status: None   Collection Time: 02/20/19  4:46 PM  Result Value Ref Range   Magnesium 2.0 1.7 - 2.4 mg/dL  Phosphorus     Status: Abnormal   Collection Time: 02/20/19  4:46 PM  Result Value Ref Range   Phosphorus 1.7 (L) 2.5 - 4.6 mg/dL  Glucose, capillary     Status: Abnormal   Collection Time: 02/20/19  7:42 PM  Result Value Ref Range   Glucose-Capillary 169 (H) 70 - 99 mg/dL  Glucose, capillary     Status: Abnormal   Collection Time: 02/20/19 11:34 PM  Result Value Ref Range   Glucose-Capillary 151 (H) 70 - 99 mg/dL  CBC     Status: Abnormal   Collection Time: 02/21/19  3:21 AM  Result Value Ref Range   WBC 15.1 (H) 4.0 - 10.5 K/uL   RBC 3.98 (L) 4.22 - 5.81  MIL/uL   Hemoglobin 11.3 (L) 13.0 - 17.0 g/dL   HCT 54.6 (L) 56.8 - 12.7 %   MCV 87.4 80.0 - 100.0 fL   MCH 28.4 26.0 - 34.0 pg   MCHC 32.5 30.0 - 36.0 g/dL   RDW 51.7 00.1 - 74.9 %   Platelets 191 150 - 400 K/uL   nRBC 0.0 0.0 - 0.2 %  Glucose, capillary     Status: Abnormal   Collection Time: 02/21/19  3:29 AM  Result Value Ref Range   Glucose-Capillary 166 (H) 70 - 99 mg/dL  Glucose, capillary     Status: Abnormal   Collection Time: 02/21/19  7:16 AM  Result Value Ref Range   Glucose-Capillary 148 (H) 70 - 99 mg/dL    Assessment & Plan: Present on Admission: . Subdural hematoma (HCC)    LOS: 2 days   Additional comments:I reviewed the patient's new clinical lab test results. . Found down TBI/B SDH/SAH/B temporal ICC/parietal skull FX/temporal infarct/pneumocephalus - per Dr. Maurice Small, F/U CT head tomorrow, Keppra 500mg  Q12 Acute hypoxic ventilator dependent respiratory failure - weaning well on 5/5. Will not extubate yet P MS improvement and F/U CT head tomorrow. [place] collar for now Hyperglycemia - start SSI FEN - start TF, BMET P VTE - PAS Dispo - ICU Critical Care Total Time*: 35 Minutes  Violeta Gelinas, MD, MPH, FACS Trauma & General Surgery: [phone]  02/21/2019  *Care during the described time interval was provided by me. I have reviewed this patient's available data, including medical history, events of note, physical examination and test results as part of my evaluation.
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
## 2019-02-21 NOTE — Progress Notes (Signed)
Pt placed back on full support at this time. Pt tolerated PSV wean (5/5) all day. RT will continue to monitor.

## 2019-02-22 ENCOUNTER — Inpatient Hospital Stay (HOSPITAL_COMMUNITY): Payer: Medicare HMO

## 2019-02-22 LAB — CBC
HCT: 33 % — ABNORMAL LOW (ref 39.0–52.0)
Hemoglobin: 10.8 g/dL — ABNORMAL LOW (ref 13.0–17.0)
MCH: 28.5 pg (ref 26.0–34.0)
MCHC: 32.7 g/dL (ref 30.0–36.0)
MCV: 87.1 fL (ref 80.0–100.0)
Platelets: 176 10*3/uL (ref 150–400)
RBC: 3.79 MIL/uL — ABNORMAL LOW (ref 4.22–5.81)
RDW: 13.7 % (ref 11.5–15.5)
WBC: 11.8 10*3/uL — ABNORMAL HIGH (ref 4.0–10.5)
nRBC: 0 % (ref 0.0–0.2)

## 2019-02-22 LAB — GLUCOSE, CAPILLARY
Glucose-Capillary: 115 mg/dL — ABNORMAL HIGH (ref 70–99)
Glucose-Capillary: 129 mg/dL — ABNORMAL HIGH (ref 70–99)
Glucose-Capillary: 109 mg/dL — ABNORMAL HIGH (ref 70–99)
Glucose-Capillary: 133 mg/dL — ABNORMAL HIGH (ref 70–99)
Glucose-Capillary: 128 mg/dL — ABNORMAL HIGH (ref 70–99)
Glucose-Capillary: 130 mg/dL — ABNORMAL HIGH (ref 70–99)

## 2019-02-22 LAB — BASIC METABOLIC PANEL
Anion gap: 8 (ref 5–15)
BUN: 17 mg/dL (ref 8–23)
CO2: 22 mmol/L (ref 22–32)
Calcium: 7.8 mg/dL — ABNORMAL LOW (ref 8.9–10.3)
Chloride: 113 mmol/L — ABNORMAL HIGH (ref 98–111)
Creatinine, Ser: 0.53 mg/dL — ABNORMAL LOW (ref 0.61–1.24)
GFR calc Af Amer: >60 mL/min (ref >60)
GFR calc non Af Amer: >60 mL/min (ref >60)
Glucose, Bld: 130 mg/dL — ABNORMAL HIGH (ref 70–99)
Potassium: 3.8 mmol/L (ref 3.5–5.1)
Sodium: 143 mmol/L (ref 135–145)

## 2019-02-22 LAB — PHOSPHORUS: Phosphorus: 1.5 mg/dL — ABNORMAL LOW (ref 2.5–4.6)

## 2019-02-22 LAB — TRIGLYCERIDES: Triglycerides: 80 mg/dL (ref <150)

## 2019-02-22 MED ORDER — BETHANECHOL CHLORIDE 10 MG PO TABS
25.0000 mg | ORAL_TABLET | Freq: Three times a day (TID) | ORAL | Status: DC
  Administered 2019-02-22 – 2019-02-28 (×19): 25 mg
  Filled 2019-02-22 (×20): qty 3

## 2019-02-22 MED ORDER — ACETAMINOPHEN 650 MG RE SUPP
650.0000 mg | RECTAL | Status: DC | PRN
  Administered 2019-02-23: 650 mg via RECTAL
  Filled 2019-02-22: qty 1

## 2019-02-22 MED ORDER — SODIUM PHOSPHATES 45 MMOLE/15ML IV SOLN
30.0000 mmol | Freq: Once | INTRAVENOUS | Status: AC
  Administered 2019-02-22: 30 mmol via INTRAVENOUS
  Filled 2019-02-22: qty 10

## 2019-02-22 MED ORDER — HYDRALAZINE HCL 20 MG/ML IJ SOLN
10.0000 mg | INTRAMUSCULAR | Status: DC | PRN
  Administered 2019-02-24: 10 mg via INTRAVENOUS
  Filled 2019-02-22 (×2): qty 1
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
## 2019-02-22 NOTE — Progress Notes (Addendum)
Patient ID: Francis Mendoza, male   DOB: 1946/04/17, 73 y.o.   MRN: [id number] Follow up - Trauma Critical Care  Patient Details:    Francis Mendoza is an 73 y.o. male.  Lines/tubes : Airway 7.5 mm (Active)  Secured at (cm) 23 cm 02/22/2019  4:44 AM  Measured From Lips 02/22/2019  4:44 AM  Secured Location Left 02/22/2019  4:44 AM  Secured By [employer] 02/22/2019  4:44 AM  Tube Holder Repositioned Yes 02/22/2019  4:44 AM  Cuff Pressure (cm H2O) 28 cm H2O 02/21/2019 10:40 PM  Site Condition Dry 02/22/2019  4:44 AM     NG/OG Tube Orogastric Center mouth Xray (Active)  External Length of Tube (cm) - (if applicable) 41 cm 02/22/2019  4:00 AM  Site Assessment Clean;Dry;Intact 02/21/2019  8:00 PM  Ongoing Placement Verification No change in cm markings or external length of tube from initial placement;No change in respiratory status;No acute changes, not attributed to clinical condition 02/21/2019  8:00 PM  Status Infusing tube feed 02/22/2019  4:00 AM     Urethral Catheter Tera Mater, RN Straight-tip 16 Fr. (Active)  Indication for Insertion or Continuance of Catheter Acute urinary retention (I&O Cath for 24 hrs prior to catheter insertion- Inpatient Only) 02/21/2019  8:00 PM  Site Assessment Clean;Intact;Dry 02/21/2019  8:00 PM  Catheter Maintenance Bag below level of bladder;Insertion date on drainage bag;Catheter secured;No dependent loops;Drainage bag/tubing not touching floor;Seal intact 02/21/2019  8:00 PM  Collection Container Standard drainage bag 02/21/2019  8:00 PM  Securement Method Securing device (Describe) 02/21/2019  8:00 PM  Urinary Catheter Interventions Unclamped 02/21/2019  8:00 PM  Output (mL) 125 mL 02/22/2019  6:00 AM    Microbiology/Sepsis markers: Results for orders placed or performed during the hospital encounter of 02/23/2019  SARS Coronavirus 2 (CEPHEID- Performed in [facility] hospital lab), Hosp Order     Status: None   Collection Time: 01/31/2019 11:57 PM  Result  Value Ref Range Status   SARS Coronavirus 2 NEGATIVE NEGATIVE Final    Comment: (NOTE) If result is NEGATIVE SARS-CoV-2 target nucleic acids are NOT DETECTED. The SARS-CoV-2 RNA is generally detectable in upper and lower  respiratory specimens during the acute phase of infection. The lowest  concentration of SARS-CoV-2 viral copies this assay can detect is 250  copies / mL. A negative result does not preclude SARS-CoV-2 infection  and should not be used as the sole basis for treatment or other  patient management decisions.  A negative result may occur with  improper specimen collection / handling, submission of specimen other  than nasopharyngeal swab, presence of viral mutation(s) within the  areas targeted by this assay, and inadequate number of viral copies  (<250 copies / mL). A negative result must be combined with clinical  observations, patient history, and epidemiological information. If result is POSITIVE SARS-CoV-2 target nucleic acids are DETECTED. The SARS-CoV-2 RNA is generally detectable in upper and lower  respiratory specimens dur ing the acute phase of infection.  Positive  results are indicative of active infection with SARS-CoV-2.  Clinical  correlation with patient history and other diagnostic information is  necessary to determine patient infection status.  Positive results do  not rule out bacterial infection or co-infection with other viruses. If result is PRESUMPTIVE POSTIVE SARS-CoV-2 nucleic acids MAY BE PRESENT.   A presumptive positive result was obtained on the submitted specimen  and confirmed on repeat testing.  While 2019 novel coronavirus  (SARS-CoV-2) nucleic acids may be  present in the submitted sample  additional confirmatory testing may be necessary for epidemiological  and / or clinical management purposes  to differentiate between  SARS-CoV-2 and other Sarbecovirus currently known to infect humans.  If clinically indicated additional testing  with an alternate test  methodology [phone]([id number]) is advised. The SARS-CoV-2 RNA is generally  detectable in upper and lower respiratory sp ecimens during the acute  phase of infection. The expected result is Negative. Fact Sheet for Patients:  BoilerBrush.com.cyhttps://www.fda.gov/media/136312/download Fact Sheet for Healthcare Providers: https://pope.com/https://www.fda.gov/media/136313/download This test is not yet approved or cleared by the [place] FDA and has been authorized for detection and/or diagnosis of SARS-CoV-2 by FDA under an Emergency Use Authorization (EUA).  This EUA will remain in effect (meaning this test can be used) for the duration of the COVID-19 declaration under Section 564(b)(1) of the Act, 21 U.S.C. section 360bbb-3(b)(1), unless the authorization is terminated or revoked sooner. Performed at [facility], [address] [place], [place] [address]   MRSA PCR Screening     Status: None   Collection Time: 02/19/19  1:19 AM  Result Value Ref Range Status   MRSA by PCR NEGATIVE NEGATIVE Final    Comment:        The GeneXpert MRSA Assay (FDA approved for NASAL specimens only), is one component of a comprehensive MRSA colonization surveillance program. It is not intended to diagnose MRSA infection nor to guide or monitor treatment for MRSA infections. Performed at [facility], [address] [place], [place] [address]     Anti-infectives:  Anti-infectives (From admission, onward)   None      Best Practice/Protocols:  VTE Prophylaxis: Mechanical Continous Sedation  Consults: Treatment Team:  Jadene Pierinistergard, Thomas A, MD    Studies:    Events:  Subjective:    Overnight Issues:   Objective:  Vital signs for last 24 hours: Temp:  [97.8 F (36.6 C)-99.3 F (37.4 C)] 98.8 F (37.1 C) (05/29 0400) Pulse Rate:  [69-99] 71 (05/29 0700) Resp:  [15-26] 15 (05/29 0700) BP: (107-193)/(52-86) 129/55 (05/29 0700) SpO2:  [97 %-100 %] 98 % (05/29 0700) FiO2 (%):   [40 %] 40 % (05/29 0444) Weight:  [74.4 kg] 74.4 kg (05/29 0500)  Hemodynamic parameters for last 24 hours:    Intake/Output from previous day: 05/28 0701 - 05/29 0700 In: 4064.9 [I.V.:2536.8; NG/GT:1080; IV Piggyback:448.1] Out: 1925 [Urine:1925]  Intake/Output this shift: No intake/output data recorded.  Vent settings for last 24 hours: Vent Mode: PRVC FiO2 (%):  [40 %] 40 % Set Rate:  [14 bmp] 14 bmp Vt Set:  [600 mL] 600 mL PEEP:  [5 cmH20-8 cmH20] 8 cmH20 Pressure Support:  [5 cmH20] 5 cmH20 Plateau Pressure:  [16 cmH20-18 cmH20] 16 cmH20  Physical Exam:  General: on vent Neuro: pupils 3 and slug HEENT/Neck: ETT and periorbital ecchymosis Resp: clear to auscultation bilaterally CVS: RRR GI: abd soft, NT Extremities: no edema, no erythema, pulses WNL  Results for orders placed or performed during the hospital encounter of 04-23-2019 (from the past 24 hour(s))  Glucose, capillary     Status: Abnormal   Collection Time: 02/21/19 11:02 AM  Result Value Ref Range   Glucose-Capillary 145 (H) 70 - 99 mg/dL  Glucose, capillary     Status: Abnormal   Collection Time: 02/21/19  3:56 PM  Result Value Ref Range   Glucose-Capillary 159 (H) 70 - 99 mg/dL  Magnesium     Status: None   Collection Time:  02/21/19  4:17 PM  Result Value Ref Range   Magnesium 2.0 1.7 - 2.4 mg/dL  Phosphorus     Status: Abnormal   Collection Time: 02/21/19  4:17 PM  Result Value Ref Range   Phosphorus 1.5 (L) 2.5 - 4.6 mg/dL  Glucose, capillary     Status: Abnormal   Collection Time: 02/21/19  7:19 PM  Result Value Ref Range   Glucose-Capillary 118 (H) 70 - 99 mg/dL  Glucose, capillary     Status: Abnormal   Collection Time: 02/21/19 11:23 PM  Result Value Ref Range   Glucose-Capillary 132 (H) 70 - 99 mg/dL  Triglycerides     Status: None   Collection Time: 02/22/19 12:31 AM  Result Value Ref Range   Triglycerides 80 <150 mg/dL  CBC     Status: Abnormal   Collection Time: 02/22/19 12:31 AM   Result Value Ref Range   WBC 11.8 (H) 4.0 - 10.5 K/uL   RBC 3.79 (L) 4.22 - 5.81 MIL/uL   Hemoglobin 10.8 (L) 13.0 - 17.0 g/dL   HCT 40.9 (L) 81.1 - 91.4 %   MCV 87.1 80.0 - 100.0 fL   MCH 28.5 26.0 - 34.0 pg   MCHC 32.7 30.0 - 36.0 g/dL   RDW 78.2 95.6 - 21.3 %   Platelets 176 150 - 400 K/uL   nRBC 0.0 0.0 - 0.2 %  Basic metabolic panel     Status: Abnormal   Collection Time: 02/22/19 12:31 AM  Result Value Ref Range   Sodium 143 135 - 145 mmol/L   Potassium 3.8 3.5 - 5.1 mmol/L   Chloride 113 (H) 98 - 111 mmol/L   CO2 22 22 - 32 mmol/L   Glucose, Bld 130 (H) 70 - 99 mg/dL   BUN 17 8 - 23 mg/dL   Creatinine, Ser 0.86 (L) 0.61 - 1.24 mg/dL   Calcium 7.8 (L) 8.9 - 10.3 mg/dL   GFR calc non Af Amer >60 >60 mL/min   GFR calc Af Amer >60 >60 mL/min   Anion gap 8 5 - 15  Glucose, capillary     Status: Abnormal   Collection Time: 02/22/19  3:25 AM  Result Value Ref Range   Glucose-Capillary 115 (H) 70 - 99 mg/dL  Glucose, capillary     Status: Abnormal   Collection Time: 02/22/19  7:41 AM  Result Value Ref Range   Glucose-Capillary 129 (H) 70 - 99 mg/dL    Assessment & Plan: Present on Admission: . Subdural hematoma (HCC)    LOS: 3 days   Additional comments:I reviewed the patient's new clinical lab test results. 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 Maurice Small, F/U CT head today shows L temporal infarct is larger but ICC areas are less, Keppra  Q12 Acute hypoxic ventilator dependent respiratory failure - weaning but await MS improvement for extubaiton ABL anemia - mild [place] collar for now Hyperglycemia - start SSI FEN - TF, Na 143 VTE - PAS Dispo - ICU Critical Care Total Time*: 34 Minutes  Violeta Gelinas, MD, MPH, FACS Trauma & General Surgery: [phone]  02/22/2019  *Care during the described time interval was provided by me. I have reviewed this patient's available data,  including medical history, events of note, physical examination and test results as part of my evaluation.

## 2019-02-23 ENCOUNTER — Inpatient Hospital Stay (HOSPITAL_COMMUNITY): Payer: Medicare HMO

## 2019-02-23 LAB — BASIC METABOLIC PANEL
Anion gap: 10 (ref 5–15)
BUN: 18 mg/dL (ref 8–23)
CO2: 21 mmol/L — ABNORMAL LOW (ref 22–32)
Calcium: 7.8 mg/dL — ABNORMAL LOW (ref 8.9–10.3)
Chloride: 109 mmol/L (ref 98–111)
Creatinine, Ser: 0.54 mg/dL — ABNORMAL LOW (ref 0.61–1.24)
GFR calc Af Amer: >60 mL/min (ref >60)
GFR calc non Af Amer: >60 mL/min (ref >60)
Glucose, Bld: 155 mg/dL — ABNORMAL HIGH (ref 70–99)
Potassium: 3.6 mmol/L (ref 3.5–5.1)
Sodium: 140 mmol/L (ref 135–145)

## 2019-02-23 LAB — GLUCOSE, CAPILLARY
Glucose-Capillary: 131 mg/dL — ABNORMAL HIGH (ref 70–99)
Glucose-Capillary: 126 mg/dL — ABNORMAL HIGH (ref 70–99)
Glucose-Capillary: 131 mg/dL — ABNORMAL HIGH (ref 70–99)
Glucose-Capillary: 146 mg/dL — ABNORMAL HIGH (ref 70–99)
Glucose-Capillary: 135 mg/dL — ABNORMAL HIGH (ref 70–99)
Glucose-Capillary: 147 mg/dL — ABNORMAL HIGH (ref 70–99)

## 2019-02-23 LAB — URINALYSIS, COMPLETE (UACMP) WITH MICROSCOPIC
Bacteria, UA: NONE SEEN
Bilirubin Urine: NEGATIVE
Glucose, UA: 250 mg/dL — AB
Ketones, ur: NEGATIVE mg/dL
Leukocytes,Ua: NEGATIVE
Nitrite: NEGATIVE
Protein, ur: 30 mg/dL — AB
Specific Gravity, Urine: 1.015 (ref 1.005–1.030)
Squamous Epithelial / LPF: NONE SEEN (ref 0–5)
pH: 8 (ref 5.0–8.0)

## 2019-02-23 LAB — CBC
HCT: 32.5 % — ABNORMAL LOW (ref 39.0–52.0)
Hemoglobin: 10.7 g/dL — ABNORMAL LOW (ref 13.0–17.0)
MCH: 28.6 pg (ref 26.0–34.0)
MCHC: 32.9 g/dL (ref 30.0–36.0)
MCV: 86.9 fL (ref 80.0–100.0)
Platelets: 210 10*3/uL (ref 150–400)
RBC: 3.74 MIL/uL — ABNORMAL LOW (ref 4.22–5.81)
RDW: 13.6 % (ref 11.5–15.5)
WBC: 16.1 10*3/uL — ABNORMAL HIGH (ref 4.0–10.5)
nRBC: 0 % (ref 0.0–0.2)

## 2019-02-23 LAB — TRIGLYCERIDES: Triglycerides: 73 mg/dL (ref <150)

## 2019-02-23 MED ORDER — ACETAMINOPHEN 325 MG PO TABS: 650.0000 mg | ORAL_TABLET | Freq: Four times a day (QID) | ORAL | Status: DC | PRN

## 2019-02-23 MED ORDER — VANCOMYCIN HCL IN DEXTROSE 1-5 GM/200ML-% IV SOLN
1000.0000 mg | Freq: Two times a day (BID) | INTRAVENOUS | Status: DC
  Administered 2019-02-24 – 2019-02-26 (×5): 1000 mg via INTRAVENOUS
  Filled 2019-02-23 (×5): qty 200

## 2019-02-23 MED ORDER — ACETAMINOPHEN 650 MG RE SUPP: 650.0000 mg | Freq: Four times a day (QID) | RECTAL | Status: DC | PRN

## 2019-02-23 MED ORDER — VANCOMYCIN HCL 10 G IV SOLR
1500.0000 mg | Freq: Once | INTRAVENOUS | Status: AC
  Administered 2019-02-23: 16:00:00 1500 mg via INTRAVENOUS
  Filled 2019-02-23: qty 1500

## 2019-02-23 MED ORDER — ACETAMINOPHEN 160 MG/5ML PO SOLN
650.0000 mg | Freq: Four times a day (QID) | ORAL | Status: DC | PRN
  Administered 2019-02-24 (×2): 650 mg
  Filled 2019-02-23 (×2): qty 20.3

## 2019-02-23 MED ORDER — SODIUM CHLORIDE 0.9 % IV SOLN
2.0000 g | Freq: Three times a day (TID) | INTRAVENOUS | Status: DC
  Administered 2019-02-23 – 2019-02-26 (×9): 2 g via INTRAVENOUS
  Filled 2019-02-23 (×11): qty 2

## 2019-02-23 NOTE — Progress Notes (Signed)
Pharmacy Antibiotic Note  Francis Mendoza is a 73 y.o. male admitted on Feb 19, 2019 with TBI.  Pharmacy has been consulted for Vancomycin and Cefepime dosing. He has new fevers and increasing WBC. Starting Vanc/Cefepime for pneumonia.  His renal function is stable.  Vancomycin 1000 mg IV Q 12 hrs. Goal AUC 400-550. Expected AUC: 473 SCr used: 0.8 (rounded up from 0.54)   Plan: Cefepime 2g IV q8h Vancomycin 1500mg  IV x 1 loading dose, then 1000mg  IV q12h Monitor renal function and micro data Anticipate checking Vancomycin levels to calculate AUC at steady state  Height: 5\' 9"  (175.3 cm) Weight: 165 lb 12.6 oz (75.2 kg) IBW/kg (Calculated) : 70.7  Temp (24hrs), Avg:99.6 F (37.6 C), Min:98.6 F (37 C), Max:101.3 F (38.5 C)  Recent Labs  Lab 02/19/19 0100 02/20/19 0431 02/21/19 0321 02/22/19 0031 02/23/19 0258  WBC 17.2* 11.5* 15.1* 11.8* 16.1*  CREATININE 0.82 0.69 0.72 0.53* 0.54*    Estimated Creatinine Clearance: 83.5 mL/min (A) (by C-G formula based on SCr of 0.54 mg/dL (L)).    No Known Allergies  Antimicrobials this admission: Cefepime 5/30>> Vanc 5/30>>  Vancomycin 1000 mg IV Q 12 hrs. Goal AUC 400-550. Expected AUC: 473 SCr used: 0.8 (rounded up from 0.54)  Microbiology results: 5/25 COVID neg 5/26 MRSA PCR neg 5/30 Resp >> 5/30 Blood >> 5/30 Urine >>  Thank you for allowing pharmacy to be a part of this patient's care.  Estella Husk, Pharm.D., BCPS, BCIDP Clinical Pharmacist Phone: [phone] Please check AMION for all [facility] Pharmacy numbers 02/23/2019, 12:22 PM

## 2019-02-23 NOTE — Progress Notes (Signed)
Patient transported on vent to CT and returned to 4N19 without any complications.

## 2019-02-23 NOTE — Progress Notes (Addendum)
Patient ID: Francis Mendoza, male   DOB: 1946/04/17, 73 y.o.   MRN: [id number] Follow up - Trauma Critical Care  Patient Details:    Francis Mendoza is an 73 y.o. male.  Lines/tubes : Airway 7.5 mm (Active)  Secured at (cm) 23 cm 02/22/2019  4:44 AM  Measured From Lips 02/22/2019  4:44 AM  Secured Location Left 02/22/2019  4:44 AM  Secured By [employer] 02/22/2019  4:44 AM  Tube Holder Repositioned Yes 02/22/2019  4:44 AM  Cuff Pressure (cm H2O) 28 cm H2O 02/21/2019 10:40 PM  Site Condition Dry 02/22/2019  4:44 AM     NG/OG Tube Orogastric Center mouth Xray (Active)  External Length of Tube (cm) - (if applicable) 41 cm 02/22/2019  4:00 AM  Site Assessment Clean;Dry;Intact 02/21/2019  8:00 PM  Ongoing Placement Verification No change in cm markings or external length of tube from initial placement;No change in respiratory status;No acute changes, not attributed to clinical condition 02/21/2019  8:00 PM  Status Infusing tube feed 02/22/2019  4:00 AM     Urethral Catheter Tera Mater, RN Straight-tip 16 Fr. (Active)  Indication for Insertion or Continuance of Catheter Acute urinary retention (I&O Cath for 24 hrs prior to catheter insertion- Inpatient Only) 02/21/2019  8:00 PM  Site Assessment Clean;Intact;Dry 02/21/2019  8:00 PM  Catheter Maintenance Bag below level of bladder;Insertion date on drainage bag;Catheter secured;No dependent loops;Drainage bag/tubing not touching floor;Seal intact 02/21/2019  8:00 PM  Collection Container Standard drainage bag 02/21/2019  8:00 PM  Securement Method Securing device (Describe) 02/21/2019  8:00 PM  Urinary Catheter Interventions Unclamped 02/21/2019  8:00 PM  Output (mL) 125 mL 02/22/2019  6:00 AM    Microbiology/Sepsis markers: Results for orders placed or performed during the hospital encounter of 02/23/2019  SARS Coronavirus 2 (CEPHEID- Performed in [facility] hospital lab), Hosp Order     Status: None   Collection Time: 01/31/2019 11:57 PM  Result  Value Ref Range Status   SARS Coronavirus 2 NEGATIVE NEGATIVE Final    Comment: (NOTE) If result is NEGATIVE SARS-CoV-2 target nucleic acids are NOT DETECTED. The SARS-CoV-2 RNA is generally detectable in upper and lower  respiratory specimens during the acute phase of infection. The lowest  concentration of SARS-CoV-2 viral copies this assay can detect is 250  copies / mL. A negative result does not preclude SARS-CoV-2 infection  and should not be used as the sole basis for treatment or other  patient management decisions.  A negative result may occur with  improper specimen collection / handling, submission of specimen other  than nasopharyngeal swab, presence of viral mutation(s) within the  areas targeted by this assay, and inadequate number of viral copies  (<250 copies / mL). A negative result must be combined with clinical  observations, patient history, and epidemiological information. If result is POSITIVE SARS-CoV-2 target nucleic acids are DETECTED. The SARS-CoV-2 RNA is generally detectable in upper and lower  respiratory specimens dur ing the acute phase of infection.  Positive  results are indicative of active infection with SARS-CoV-2.  Clinical  correlation with patient history and other diagnostic information is  necessary to determine patient infection status.  Positive results do  not rule out bacterial infection or co-infection with other viruses. If result is PRESUMPTIVE POSTIVE SARS-CoV-2 nucleic acids MAY BE PRESENT.   A presumptive positive result was obtained on the submitted specimen  and confirmed on repeat testing.  While 2019 novel coronavirus  (SARS-CoV-2) nucleic acids may be  present in the submitted sample  additional confirmatory testing may be necessary for epidemiological  and / or clinical management purposes  to differentiate between  SARS-CoV-2 and other Sarbecovirus currently known to infect humans.  If clinically indicated additional testing  with an alternate test  methodology [phone]) is advised. The SARS-CoV-2 RNA is generally  detectable in upper and lower respiratory sp ecimens during the acute  phase of infection. The expected result is Negative. Fact Sheet for Patients:  BoilerBrush.com.cyhttps://www.fda.gov/media/136312/download Fact Sheet for Healthcare Providers: https://pope.com/https://www.fda.gov/media/136313/download This test is not yet approved or cleared by the [place] FDA and has been authorized for detection and/or diagnosis of SARS-CoV-2 by FDA under an Emergency Use Authorization (EUA).  This EUA will remain in effect (meaning this test can be used) for the duration of the COVID-19 declaration under Section 564(b)(1) of the Act, 21 U.S.C. section 360bbb-3(b)(1), unless the authorization is terminated or revoked sooner. Performed at [facility], [address] [place], [place] [address]   MRSA PCR Screening     Status: None   Collection Time: 02/19/19  1:19 AM  Result Value Ref Range Status   MRSA by PCR NEGATIVE NEGATIVE Final    Comment:        The GeneXpert MRSA Assay (FDA approved for NASAL specimens only), is one component of a comprehensive MRSA colonization surveillance program. It is not intended to diagnose MRSA infection nor to guide or monitor treatment for MRSA infections. Performed at [facility], [address] [place], [place] [address]     Anti-infectives:  Anti-infectives (From admission, onward)   None      Best Practice/Protocols:  VTE Prophylaxis: Mechanical Continous Sedation  Consults: Treatment Team:  Jadene Pierinistergard, Thomas A, MD    Studies:    Events:  Subjective:    Overnight Issues:  Had decreased muscle tone on right upper extremity.  Stat head CT performed.   Objective:  Vital signs for last 24 hours: Temp:  [98.6 F (37 C)-101.3 F (38.5 C)] 98.8 F (37.1 C) (05/30 0800) Pulse Rate:  [37-133] 68 (05/30 0958) Resp:  [15-27] 26 (05/30 0958) BP:  (93-203)/(40-102) 148/102 (05/30 0958) SpO2:  [94 %-100 %] 100 % (05/30 0958) FiO2 (%):  [40 %-60 %] 40 % (05/30 0958) Weight:  [75.2 kg] 75.2 kg (05/30 0500)  Hemodynamic parameters for last 24 hours:    Intake/Output from previous day: 05/29 0701 - 05/30 0700 In: 4051.2 [I.V.:2524.6; NG/GT:1080; IV Piggyback:446.6] Out: 2850 [Urine:2850]  Intake/Output this shift: Total I/O In: 156.8 [I.V.:111.8; NG/GT:45] Out: -   Vent settings for last 24 hours: Vent Mode: PSV;CPAP FiO2 (%):  [40 %-60 %] 40 % Set Rate:  [14 bmp] 14 bmp Vt Set:  [600 mL] 600 mL PEEP:  [5 cmH20] 5 cmH20 Pressure Support:  [5 cmH20] 5 cmH20 Plateau Pressure:  [14 cmH20-19 cmH20] 14 cmH20  Physical Exam:  General: on vent Neuro: pupils 3 and equal.  Sluggish. HEENT/Neck: ETT and periorbital ecchymosis Resp: synchronous CVS: RRR GI: abd soft, NT, ND Extremities: no edema, no erythema, pulses WNL  Results for orders placed or performed during the hospital encounter of 06/01/19 (from the past 24 hour(s))  Glucose, capillary     Status: Abnormal   Collection Time: 02/22/19 11:22 AM  Result Value Ref Range   Glucose-Capillary 109 (H) 70 - 99 mg/dL  Glucose, capillary     Status: Abnormal   Collection Time: 02/22/19  3:46 PM  Result Value Ref Range  Glucose-Capillary 133 (H) 70 - 99 mg/dL  Glucose, capillary     Status: Abnormal   Collection Time: 02/22/19  7:19 PM  Result Value Ref Range   Glucose-Capillary 128 (H) 70 - 99 mg/dL  Glucose, capillary     Status: Abnormal   Collection Time: 02/22/19 11:13 PM  Result Value Ref Range   Glucose-Capillary 130 (H) 70 - 99 mg/dL  Triglycerides     Status: None   Collection Time: 02/23/19  2:58 AM  Result Value Ref Range   Triglycerides 73 <150 mg/dL  CBC     Status: Abnormal   Collection Time: 02/23/19  2:58 AM  Result Value Ref Range   WBC 16.1 (H) 4.0 - 10.5 K/uL   RBC 3.74 (L) 4.22 - 5.81 MIL/uL   Hemoglobin 10.7 (L) 13.0 - 17.0 g/dL   HCT 35.4 (L)  65.6 - 52.0 %   MCV 86.9 80.0 - 100.0 fL   MCH 28.6 26.0 - 34.0 pg   MCHC 32.9 30.0 - 36.0 g/dL   RDW 81.2 75.1 - 70.0 %   Platelets 210 150 - 400 K/uL   nRBC 0.0 0.0 - 0.2 %  Basic metabolic panel     Status: Abnormal   Collection Time: 02/23/19  2:58 AM  Result Value Ref Range   Sodium 140 135 - 145 mmol/L   Potassium 3.6 3.5 - 5.1 mmol/L   Chloride 109 98 - 111 mmol/L   CO2 21 (L) 22 - 32 mmol/L   Glucose, Bld 155 (H) 70 - 99 mg/dL   BUN 18 8 - 23 mg/dL   Creatinine, Ser 1.74 (L) 0.61 - 1.24 mg/dL   Calcium 7.8 (L) 8.9 - 10.3 mg/dL   GFR calc non Af Amer >60 >60 mL/min   GFR calc Af Amer >60 >60 mL/min   Anion gap 10 5 - 15  Glucose, capillary     Status: Abnormal   Collection Time: 02/23/19  3:33 AM  Result Value Ref Range   Glucose-Capillary 131 (H) 70 - 99 mg/dL  Glucose, capillary     Status: Abnormal   Collection Time: 02/23/19  7:44 AM  Result Value Ref Range   Glucose-Capillary 126 (H) 70 - 99 mg/dL   Comment 1 Notify RN    Comment 2 Document in Chart     Assessment & Plan: Present on Admission: . Subdural hematoma (HCC)    LOS: 4 days   Additional comments:I reviewed the patient's new clinical lab test results. 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   Acute hypoxic ventilator dependent respiratory failure - weaning but await MS improvement for extubation.  Might require trach if can't protect airway.   ABL anemia - mild ID - increased leukocytosis.  Had temp.  Get cultures. Probable pneumonia.  Start empiric antibiotics.     [place] collar for now Hyperglycemia - start SSI FEN - TF, Na normal VTE - PAS Dispo - ICU Critical Care Total Time*: 31 Minutes  Almond Lint, MD [facility] Surgical Oncology, General Surgery, Trauma/Critical Care  02/23/2019  *Care during the described time interval was provided by me.  I have reviewed this patient's available data, including medical history, events of note, physical examination and test results as part of my evaluation.

## 2019-02-23 NOTE — Progress Notes (Signed)
Patient ID: Francis Mendoza, male   DOB: 1946-08-06, 73 y.o.   MRN: [id number] Patient's vital signs are stable however on exam this morning the nurse notes and patient demonstrates that he is not moving his right upper extremity or his right lower extremity.  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  In light of the change in his exam I have advised we should do a CT scan which is ordered presently.  Pupils otherwise remained stable and reactive.

## 2019-02-24 LAB — BASIC METABOLIC PANEL
Anion gap: 9 (ref 5–15)
Anion gap: 7 (ref 5–15)
BUN: 19 mg/dL (ref 8–23)
BUN: 20 mg/dL (ref 8–23)
CO2: 23 mmol/L (ref 22–32)
CO2: 23 mmol/L (ref 22–32)
Calcium: 8.1 mg/dL — ABNORMAL LOW (ref 8.9–10.3)
Calcium: 8 mg/dL — ABNORMAL LOW (ref 8.9–10.3)
Chloride: 107 mmol/L (ref 98–111)
Chloride: 111 mmol/L (ref 98–111)
Creatinine, Ser: 0.5 mg/dL — ABNORMAL LOW (ref 0.61–1.24)
Creatinine, Ser: 0.43 mg/dL — ABNORMAL LOW (ref 0.61–1.24)
GFR calc Af Amer: >60 mL/min (ref >60)
GFR calc Af Amer: >60 mL/min (ref >60)
GFR calc non Af Amer: >60 mL/min (ref >60)
GFR calc non Af Amer: >60 mL/min (ref >60)
Glucose, Bld: 164 mg/dL — ABNORMAL HIGH (ref 70–99)
Glucose, Bld: 152 mg/dL — ABNORMAL HIGH (ref 70–99)
Potassium: 3.7 mmol/L (ref 3.5–5.1)
Potassium: 3.8 mmol/L (ref 3.5–5.1)
Sodium: 139 mmol/L (ref 135–145)
Sodium: 141 mmol/L (ref 135–145)

## 2019-02-24 LAB — GLUCOSE, CAPILLARY
Glucose-Capillary: 143 mg/dL — ABNORMAL HIGH (ref 70–99)
Glucose-Capillary: 139 mg/dL — ABNORMAL HIGH (ref 70–99)
Glucose-Capillary: 122 mg/dL — ABNORMAL HIGH (ref 70–99)
Glucose-Capillary: 154 mg/dL — ABNORMAL HIGH (ref 70–99)
Glucose-Capillary: 132 mg/dL — ABNORMAL HIGH (ref 70–99)
Glucose-Capillary: 136 mg/dL — ABNORMAL HIGH (ref 70–99)

## 2019-02-24 LAB — BLOOD CULTURE ID PANEL (REFLEXED)

## 2019-02-24 LAB — CBC
HCT: 31.8 % — ABNORMAL LOW (ref 39.0–52.0)
Hemoglobin: 10.5 g/dL — ABNORMAL LOW (ref 13.0–17.0)
MCH: 28.5 pg (ref 26.0–34.0)
MCHC: 33 g/dL (ref 30.0–36.0)
MCV: 86.2 fL (ref 80.0–100.0)
Platelets: 240 10*3/uL (ref 150–400)
RBC: 3.69 MIL/uL — ABNORMAL LOW (ref 4.22–5.81)
RDW: 13.4 % (ref 11.5–15.5)
WBC: 14.2 10*3/uL — ABNORMAL HIGH (ref 4.0–10.5)
nRBC: 0 % (ref 0.0–0.2)

## 2019-02-24 LAB — MAGNESIUM: Magnesium: 1.9 mg/dL (ref 1.7–2.4)

## 2019-02-24 LAB — TRIGLYCERIDES: Triglycerides: 75 mg/dL (ref <150)

## 2019-02-24 MED ORDER — LABETALOL HCL 5 MG/ML IV SOLN
INTRAVENOUS | Status: AC
  Filled 2019-02-24: qty 4

## 2019-02-24 MED ORDER — BISACODYL 10 MG RE SUPP: 10.0000 mg | Freq: Every day | RECTAL | Status: DC | PRN

## 2019-02-24 MED ORDER — LABETALOL HCL 5 MG/ML IV SOLN
10.0000 mg | INTRAVENOUS | Status: DC | PRN
  Administered 2019-02-24 – 2019-02-25 (×2): 10 mg via INTRAVENOUS
  Filled 2019-02-24: qty 4

## 2019-02-24 NOTE — Progress Notes (Signed)
Subjective/Chief Complaint: Agitated now secondary to tube, a lot of secretions   Objective: Vital signs in last 24 hours: Temp:  [98.3 F (36.8 C)-99.3 F (37.4 C)] 99.3 F (37.4 C) (05/31 0300) Pulse Rate:  [40-116] 116 (05/31 0757) Resp:  [18-29] 25 (05/31 0757) BP: (131-185)/(50-102) 185/76 (05/31 0757) SpO2:  [96 %-100 %] 98 % (05/31 0757) FiO2 (%):  [40 %] 40 % (05/31 0757) Weight:  [74.3 kg] 74.3 kg (05/31 0500) Last BM Date: (pta)  Intake/Output from previous day: 05/30 0701 - 05/31 0700 In: 4061.3 [I.V.:1926.4; NG/GT:1035; IV Piggyback:1099.9] Out: 1900 [Urine:1900] Intake/Output this shift: No intake/output data recorded.  General: on vent, agitated Neuro:pupils equal, left conjunctival hemorrhad HEENT/Neck: ETT and periorbital ecchymosis Resp: coarse bilaterally CVS: tachy rr GI: abd soft, NT, ND Extremities: no edema, no erythema  Lab Results:  Recent Labs    02/22/19 0031 02/23/19 0258  WBC 11.8* 16.1*  HGB 10.8* 10.7*  HCT 33.0* 32.5*  PLT 176 210   BMET Recent Labs    02/23/19 0258 02/24/19 0351  NA 140 139  K 3.6 3.7  CL 109 107  CO2 21* 23  GLUCOSE 155* 164*  BUN 18 19  CREATININE 0.54* 0.50*  CALCIUM 7.8* 8.1*   Studies/Results: Ct Head Wo Contrast  Result Date: 02/23/2019 CLINICAL DATA:  Right arm weakness.  Head injury 2019/03/12 EXAM: CT HEAD WITHOUT CONTRAST TECHNIQUE: Contiguous axial images were obtained from the base of the skull through the vertex without intravenous contrast. COMPARISON:  02/22/2019.  02/19/2019. FINDINGS: Brain: No injury of the brainstem or cerebellum. Hemorrhagic contusion in the right occipital lobe is showing decreasing density of the blood. Regional edema appears stable. Hemorrhagic contusion of the right temporal lobe does not show any increased hemorrhage. Regional vasogenic edema appears stable. Subdural hematoma along the left side of the falx and the left convexity is no larger, maximal thickness  5 mm. Infarction of the anterior and lateral left temporal lobe appears the same with low-density but no subsequent hemorrhage. Subarachnoid and intraventricular blood have not increased. Ventricular size is stable. Vascular: No primary vascular finding. Skull: Left-sided skull fractures as seen previously. Sinuses/Orbits: Sinus opacification in the ethmoid and sphenoid sinuses. Asymmetric mastoid fluid on the right. No skull base or temporal bone fracture is identified however. Other: None IMPRESSION: No new or worsening finding. Focal infarction in the anterior and lateral left temporal lobe without subsequent hemorrhage. Intraparenchymal, subdural, subarachnoid and intraventricular blood has not increased or worsened. No increased edema or mass effect. Electronically Signed   By: Paulina Fusi M.D.   On: 02/23/2019 10:18   Dg Chest Port 1 View  Result Date: 02/23/2019 CLINICAL DATA:  Respiratory failure EXAM: PORTABLE CHEST 1 VIEW COMPARISON:  02/21/2019 FINDINGS: Endotracheal tube terminates 7 cm above the carina. Increased interstitial markings. Mild patchy left perihilar opacities may reflect asymmetric interstitial edema versus infection (favored). Possible small left pleural effusion. No pneumothorax. The heart is normal in size. Enteric tube courses into the stomach. IMPRESSION: Increased interstitial markings with mild patchy left perihilar opacities, favoring infection over asymmetric interstitial edema. Endotracheal tube terminates 7 cm above the carina. Electronically Signed   By: Charline Bills M.D.   On: 02/23/2019 09:36    Anti-infectives: Anti-infectives (From admission, onward)   Start     Dose/Rate Route Frequency Ordered Stop   02/24/19 0200  vancomycin (VANCOCIN) IVPB 1000 mg/200 mL premix     1,000 mg 200 mL/hr over 60 Minutes Intravenous Every 12 hours  02/23/19 1224     02/23/19 1400  vancomycin (VANCOCIN) 1,500 mg in sodium chloride 0.9 % 500 mL IVPB     1,500 mg 250 mL/hr  over 120 Minutes Intravenous  Once 02/23/19 1224 02/23/19 1803   02/23/19 1300  ceFEPIme (MAXIPIME) 2 g in sodium chloride 0.9 % 100 mL IVPB     2 g 200 mL/hr over 30 Minutes Intravenous Every 8 hours 02/23/19 1224        Assessment/Plan: 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  nsurg following Acute hypoxic ventilator dependent respiratory failure - weaning but await MS improvement for extubation.  Might require trach if can't protect airway.  no way he is safe for extubation today due to secretions and mental status ABL anemia - mild ID - wbc a little lower today  Still with low grade temp today.  cx pending.  Vanc/cefepime day 2 [place] collar for now Hyperglycemia - SSI FEN - TF, Na normal VTE - PAS Dispo - ICU Critical Care Total Time*:20 Minutes Francis LoronMatthew Hazel Mendoza 02/24/2019

## 2019-02-24 NOTE — Progress Notes (Signed)
PHARMACY - PHYSICIAN COMMUNICATION CRITICAL VALUE ALERT - BLOOD CULTURE IDENTIFICATION (BCID)  Melchizedek Leachman is an 73 y.o. male who presented to [facility] on 02/16/2019 with a chief complaint of head injury  Name of physician (or Provider) Contacted: Dwain Sarna  Current antibiotics: cefepime + vancomycin  Changes to prescribed antibiotics recommended:  Likely contaminant, no changes needed. Respiratory cultures still pending.   Results for orders placed or performed during the hospital encounter of 02/14/2019  Blood Culture ID Panel (Reflexed) (Collected: 02/23/2019 11:56 AM)  Result Value Ref Range   Enterococcus species NOT DETECTED NOT DETECTED   Listeria monocytogenes NOT DETECTED NOT DETECTED   Staphylococcus species DETECTED (A) NOT DETECTED   Staphylococcus aureus (BCID) NOT DETECTED NOT DETECTED   Methicillin resistance NOT DETECTED NOT DETECTED   Streptococcus species NOT DETECTED NOT DETECTED   Streptococcus agalactiae NOT DETECTED NOT DETECTED   Streptococcus pneumoniae NOT DETECTED NOT DETECTED   Streptococcus pyogenes NOT DETECTED NOT DETECTED   Acinetobacter baumannii NOT DETECTED NOT DETECTED   Enterobacteriaceae species NOT DETECTED NOT DETECTED   Enterobacter cloacae complex NOT DETECTED NOT DETECTED   Escherichia coli NOT DETECTED NOT DETECTED   Klebsiella oxytoca NOT DETECTED NOT DETECTED   Klebsiella pneumoniae NOT DETECTED NOT DETECTED   Proteus species NOT DETECTED NOT DETECTED   Serratia marcescens NOT DETECTED NOT DETECTED   Haemophilus influenzae NOT DETECTED NOT DETECTED   Neisseria meningitidis NOT DETECTED NOT DETECTED   Pseudomonas aeruginosa NOT DETECTED NOT DETECTED   Candida albicans NOT DETECTED NOT DETECTED   Candida glabrata NOT DETECTED NOT DETECTED   Candida krusei NOT DETECTED NOT DETECTED   Candida parapsilosis NOT DETECTED NOT DETECTED   Candida tropicalis NOT DETECTED NOT DETECTED    Tilden Broz, Drake Leach 02/24/2019  3:36 PM

## 2019-02-24 NOTE — Progress Notes (Signed)
Patient ID: Francis Mendoza, Francis Mendoza   DOB: Jan 26, 1946, 73 y.o.   MRN: [id number] CT scan yesterday demonstrates no change in large left temporal venous infarct.  No significant mass-effect is noted.  Today's exam reveals that he has a flicker of movement in the right upper extremity.  This is quite minimal compared to what it was described as on Friday.  I can only offer supportive care for the present situation.  I note that his pulmonary status has deteriorated and the patient does have significant fever he is recently been started on antibiotics.  This could explain his neurologic deterioration with the stroke that he has had.

## 2019-02-25 LAB — BASIC METABOLIC PANEL
Anion gap: 10 (ref 5–15)
BUN: 20 mg/dL (ref 8–23)
CO2: 23 mmol/L (ref 22–32)
Calcium: 8.1 mg/dL — ABNORMAL LOW (ref 8.9–10.3)
Chloride: 107 mmol/L (ref 98–111)
Creatinine, Ser: 0.48 mg/dL — ABNORMAL LOW (ref 0.61–1.24)
GFR calc Af Amer: >60 mL/min (ref >60)
GFR calc non Af Amer: >60 mL/min (ref >60)
Glucose, Bld: 135 mg/dL — ABNORMAL HIGH (ref 70–99)
Potassium: 3.8 mmol/L (ref 3.5–5.1)
Sodium: 140 mmol/L (ref 135–145)

## 2019-02-25 LAB — URINE CULTURE: Culture: 10000 — AB

## 2019-02-25 LAB — GLUCOSE, CAPILLARY
Glucose-Capillary: 133 mg/dL — ABNORMAL HIGH (ref 70–99)
Glucose-Capillary: 111 mg/dL — ABNORMAL HIGH (ref 70–99)
Glucose-Capillary: 124 mg/dL — ABNORMAL HIGH (ref 70–99)
Glucose-Capillary: 130 mg/dL — ABNORMAL HIGH (ref 70–99)
Glucose-Capillary: 122 mg/dL — ABNORMAL HIGH (ref 70–99)
Glucose-Capillary: 121 mg/dL — ABNORMAL HIGH (ref 70–99)

## 2019-02-25 LAB — TRIGLYCERIDES
Triglycerides: 58 mg/dL (ref <150)
Triglycerides: 62 mg/dL (ref <150)

## 2019-02-25 MED ORDER — MAGNESIUM CITRATE PO SOLN
0.5000 | Freq: Once | ORAL | Status: AC
  Administered 2019-02-25: 0.5
  Filled 2019-02-25: qty 296

## 2019-02-25 NOTE — Progress Notes (Signed)
Notified Trauma MD of elevated BP that was not decreased by hydralazine. New order of PRN labetalol given. Also notified MD of irregular HR with frequent PACs and PVCs, which is now tachy in the 120s-140s. New order for BMP and magnesium labs to be drawn STAT. Will continue to monitor.

## 2019-02-25 NOTE — Progress Notes (Signed)
Patient ID: Francis Mendoza, male   DOB: [birth year], 73 y.o.   MRN: [id number] Follow up - Trauma Critical Care  Patient Details:    Francis Mendoza is an 73 y.o. male.  Lines/tubes : Airway 7.5 mm (Active)  Secured at (cm) 24 cm 02/25/2019  7:51 AM  Measured From Lips 02/25/2019  7:51 AM  Secured Location Center 02/25/2019  7:51 AM  Secured By [employer] 02/25/2019  7:51 AM  Tube Holder Repositioned Yes 02/25/2019  7:51 AM  Cuff Pressure (cm H2O) 26 cm H2O 02/24/2019  8:14 PM  Site Condition Dry 02/25/2019  7:51 AM     NG/OG Tube Orogastric Center mouth Xray (Active)  External Length of Tube (cm) - (if applicable) 42 cm 02/24/2019  8:00 PM  Site Assessment Clean;Dry;Intact 02/24/2019  8:00 PM  Ongoing Placement Verification Xray;No acute changes, not attributed to clinical condition;No change in respiratory status;No change in cm markings or external length of tube from initial placement 02/24/2019  8:00 PM  Status Infusing tube feed 02/24/2019  8:00 PM     External Urinary Catheter (Active)  Output (mL) 100 mL 02/25/2019  5:00 AM    Microbiology/Sepsis markers: Results for orders placed or performed during the hospital encounter of 07-Mar-2019  SARS Coronavirus 2 (CEPHEID- Performed in [facility] hospital lab), Hosp Order     Status: None   Collection Time: 2019-03-07 11:57 PM  Result Value Ref Range Status   SARS Coronavirus 2 NEGATIVE NEGATIVE Final    Comment: (NOTE) If result is NEGATIVE SARS-CoV-2 target nucleic acids are NOT DETECTED. The SARS-CoV-2 RNA is generally detectable in upper and lower  respiratory specimens during the acute phase of infection. The lowest  concentration of SARS-CoV-2 viral copies this assay can detect is 250  copies / mL. A negative result does not preclude SARS-CoV-2 infection  and should not be used as the sole basis for treatment or other  patient management decisions.  A negative result may occur with  improper specimen collection / handling,  submission of specimen other  than nasopharyngeal swab, presence of viral mutation(s) within the  areas targeted by this assay, and inadequate number of viral copies  (<250 copies / mL). A negative result must be combined with clinical  observations, patient history, and epidemiological information. If result is POSITIVE SARS-CoV-2 target nucleic acids are DETECTED. The SARS-CoV-2 RNA is generally detectable in upper and lower  respiratory specimens dur ing the acute phase of infection.  Positive  results are indicative of active infection with SARS-CoV-2.  Clinical  correlation with patient history and other diagnostic information is  necessary to determine patient infection status.  Positive results do  not rule out bacterial infection or co-infection with other viruses. If result is PRESUMPTIVE POSTIVE SARS-CoV-2 nucleic acids MAY BE PRESENT.   A presumptive positive result was obtained on the submitted specimen  and confirmed on repeat testing.  While 2019 novel coronavirus  (SARS-CoV-2) nucleic acids may be present in the submitted sample  additional confirmatory testing may be necessary for epidemiological  and / or clinical management purposes  to differentiate between  SARS-CoV-2 and other Sarbecovirus currently known to infect humans.  If clinically indicated additional testing with an alternate test  methodology [phone]) is advised. The SARS-CoV-2 RNA is generally  detectable in upper and lower respiratory sp ecimens during the acute  phase of infection. The expected result is Negative. Fact Sheet for Patients:  BoilerBrush.com.cy Fact Sheet for Healthcare Providers: https://pope.com/ This test is  not yet approved or cleared by the [place] and has been authorized for detection and/or diagnosis of SARS-CoV-2 by FDA under an Emergency Use Authorization (EUA).  This EUA will remain in effect (meaning this test can be  used) for the duration of the COVID-19 declaration under Section 564(b)(1) of the Act, 21 U.S.C. section 360bbb-3(b)(1), unless the authorization is terminated or revoked sooner. Performed at [facility], [address], [place], [place] [address]   MRSA PCR Screening     Status: None   Collection Time: 02/19/19  1:19 AM  Result Value Ref Range Status   MRSA by PCR NEGATIVE NEGATIVE Final    Comment:        The GeneXpert MRSA Assay (FDA approved for NASAL specimens only), is one component of a comprehensive MRSA colonization surveillance program. It is not intended to diagnose MRSA infection nor to guide or monitor treatment for MRSA infections. Performed at [facility], [address] [place], [place] [address]   Culture, blood (Routine X 2) w Reflex to ID Panel     Status: None (Preliminary result)   Collection Time: 02/23/19 11:56 AM  Result Value Ref Range Status   Specimen Description BLOOD LEFT HAND  Final   Special Requests   Final    BOTTLES DRAWN AEROBIC ONLY Blood Culture adequate volume   Culture  Setup Time   Final    GRAM POSITIVE COCCI IN CLUSTERS AEROBIC BOTTLE ONLY CRITICAL RESULT CALLED TO, READ BACK BY AND VERIFIED WITH: PHARMD R. RUMMBARGER [phone] [id number] FCP Performed at [facility], [address] [place], [place] [address]    Culture GRAM POSITIVE COCCI  Final   Report Status PENDING  Incomplete  Blood Culture ID Panel (Reflexed)     Status: Abnormal   Collection Time: 02/23/19 11:56 AM  Result Value Ref Range Status   Enterococcus species NOT DETECTED NOT DETECTED Final   Listeria monocytogenes NOT DETECTED NOT DETECTED Final   Staphylococcus species DETECTED (A) NOT DETECTED Final    Comment: Methicillin (oxacillin) susceptible coagulase negative staphylococcus. Possible blood culture contaminant (unless isolated from more than one blood culture draw or clinical case suggests pathogenicity). No antibiotic treatment is indicated  for blood  culture contaminants. CRITICAL RESULT CALLED TO, READ BACK BY AND VERIFIED WITH: PHARMD R. RUMMBARGER [phone] FCP    Staphylococcus aureus (BCID) NOT DETECTED NOT DETECTED Final   Methicillin resistance NOT DETECTED NOT DETECTED Final   Streptococcus species NOT DETECTED NOT DETECTED Final   Streptococcus agalactiae NOT DETECTED NOT DETECTED Final   Streptococcus pneumoniae NOT DETECTED NOT DETECTED Final   Streptococcus pyogenes NOT DETECTED NOT DETECTED Final   Acinetobacter baumannii NOT DETECTED NOT DETECTED Final   Enterobacteriaceae species NOT DETECTED NOT DETECTED Final   Enterobacter cloacae complex NOT DETECTED NOT DETECTED Final   Escherichia coli NOT DETECTED NOT DETECTED Final   Klebsiella oxytoca NOT DETECTED NOT DETECTED Final   Klebsiella pneumoniae NOT DETECTED NOT DETECTED Final   Proteus species NOT DETECTED NOT DETECTED Final   Serratia marcescens NOT DETECTED NOT DETECTED Final   Haemophilus influenzae NOT DETECTED NOT DETECTED Final   Neisseria meningitidis NOT DETECTED NOT DETECTED Final   Pseudomonas aeruginosa NOT DETECTED NOT DETECTED Final   Candida albicans NOT DETECTED NOT DETECTED Final   Candida glabrata NOT DETECTED NOT DETECTED Final   Candida krusei NOT DETECTED NOT DETECTED Final   Candida parapsilosis NOT DETECTED NOT DETECTED Final  Candida tropicalis NOT DETECTED NOT DETECTED Final    Comment: Performed at [facility], [address] [place], [place] [address]  Culture, blood (Routine X 2) w Reflex to ID Panel     Status: None (Preliminary result)   Collection Time: 02/23/19 12:02 PM  Result Value Ref Range Status   Specimen Description BLOOD RIGHT HAND  Final   Special Requests   Final    BOTTLES DRAWN AEROBIC ONLY Blood Culture adequate volume   Culture   Final    NO GROWTH 2 DAYS Performed at [facility], [address] [place], [place] [address]    Report Status PENDING  Incomplete  Culture, Urine      Status: Abnormal (Preliminary result)   Collection Time: 02/23/19 12:10 PM  Result Value Ref Range Status   Specimen Description URINE, RANDOM  Final   Special Requests   Final    NONE Performed at [facility], [address] [place], [place] [address]    Culture 10,000 COLONIES/mL STAPHYLOCOCCUS CAPITIS (A)  Final   Report Status PENDING  Incomplete  Culture, respiratory (non-expectorated)     Status: None (Preliminary result)   Collection Time: 02/23/19 12:17 PM  Result Value Ref Range Status   Specimen Description TRACHEAL ASPIRATE  Final   Special Requests NONE  Final   Gram Stain   Final    RARE WBC PRESENT, PREDOMINANTLY PMN MODERATE GRAM POSITIVE COCCI IN PAIRS FEW GRAM NEGATIVE RODS Performed at [facility], [address] [place], [place] [address]    Culture MODERATE GRAM NEGATIVE RODS  Final   Report Status PENDING  Incomplete    Anti-infectives:  Anti-infectives (From admission, onward)   Start     Dose/Rate Route Frequency Ordered Stop   02/24/19 0200  vancomycin (VANCOCIN) IVPB 1000 mg/200 mL premix     1,000 mg 200 mL/hr over 60 Minutes Intravenous Every 12 hours 02/23/19 1224     02/23/19 1400  vancomycin (VANCOCIN) 1,500 mg in sodium chloride 0.9 % 500 mL IVPB     1,500 mg 250 mL/hr over 120 Minutes Intravenous  Once 02/23/19 1224 02/23/19 1803   02/23/19 1300  ceFEPIme (MAXIPIME) 2 g in sodium chloride 0.9 % 100 mL IVPB     2 g 200 mL/hr over 30 Minutes Intravenous Every 8 hours 02/23/19 1224        Best Practice/Protocols:  VTE Prophylaxis: Mechanical Continous Sedation  Consults: Treatment Team:  Jadene Pierinistergard, Thomas A, MD    Studies:    Events:  Subjective:    Overnight Issues:   Objective:  Vital signs for last 24 hours: Temp:  [98.3 F (36.8 C)-99.6 F (37.6 C)] 99.1 F (37.3 C) (06/01 0400) Pulse Rate:  [45-134] 66 (06/01 0751) Resp:  [17-34] 17 (06/01 0751) BP: (99-199)/(50-79) 99/55 (06/01 0751) SpO2:  [95  %-100 %] 98 % (06/01 0751) FiO2 (%):  [40 %] 40 % (06/01 0751) Weight:  [74.9 kg] 74.9 kg (06/01 0500)  Hemodynamic parameters for last 24 hours:    Intake/Output from previous day: 05/31 0701 - 06/01 0700 In: 4425.6 [I.V.:2380.5; NG/GT:1125; IV Piggyback:920.1] Out: 3450 [Urine:3450]  Intake/Output this shift: No intake/output data recorded.  Vent settings for last 24 hours: Vent Mode: PSV;CPAP FiO2 (%):  [40 %] 40 % Set Rate:  [14 bmp] 14 bmp Vt Set:  [560 mL] 560 mL PEEP:  [5 cmH20] 5 cmH20 Pressure Support:  [5 cmH20] 5 cmH20 Plateau Pressure:  [  16 cmH20] 16 cmH20  Physical Exam:  General: on vent wean Neuro: PERL, sedated and unresponsive HEENT/Neck: ETT Resp: clear to auscultation bilaterally CVS: RRR 70s GI: soft, NT Extremities: no edema, no erythema, pulses WNL  Results for orders placed or performed during the hospital encounter of 02/19/2019 (from the past 24 hour(s))  Glucose, capillary     Status: Abnormal   Collection Time: 02/24/19 11:44 AM  Result Value Ref Range   Glucose-Capillary 122 (H) 70 - 99 mg/dL   Comment 1 Notify RN    Comment 2 Document in Chart   Glucose, capillary     Status: Abnormal   Collection Time: 02/24/19  3:47 PM  Result Value Ref Range   Glucose-Capillary 154 (H) 70 - 99 mg/dL   Comment 1 Notify RN    Comment 2 Document in Chart   Glucose, capillary     Status: Abnormal   Collection Time: 02/24/19  7:21 PM  Result Value Ref Range   Glucose-Capillary 132 (H) 70 - 99 mg/dL  Glucose, capillary     Status: Abnormal   Collection Time: 02/24/19 11:10 PM  Result Value Ref Range   Glucose-Capillary 136 (H) 70 - 99 mg/dL  CBC     Status: Abnormal   Collection Time: 02/24/19 11:15 PM  Result Value Ref Range   WBC 14.2 (H) 4.0 - 10.5 K/uL   RBC 3.69 (L) 4.22 - 5.81 MIL/uL   Hemoglobin 10.5 (L) 13.0 - 17.0 g/dL   HCT 16.1 (L) 09.6 - 04.5 %   MCV 86.2 80.0 - 100.0 fL   MCH 28.5 26.0 - 34.0 pg   MCHC 33.0 30.0 - 36.0 g/dL   RDW 40.9  81.1 - 91.4 %   Platelets 240 150 - 400 K/uL   nRBC 0.0 0.0 - 0.2 %  Triglycerides     Status: None   Collection Time: 02/24/19 11:15 PM  Result Value Ref Range   Triglycerides 58 <150 mg/dL  Basic metabolic panel     Status: Abnormal   Collection Time: 02/24/19 11:15 PM  Result Value Ref Range   Sodium 141 135 - 145 mmol/L   Potassium 3.8 3.5 - 5.1 mmol/L   Chloride 111 98 - 111 mmol/L   CO2 23 22 - 32 mmol/L   Glucose, Bld 152 (H) 70 - 99 mg/dL   BUN 20 8 - 23 mg/dL   Creatinine, Ser 7.82 (L) 0.61 - 1.24 mg/dL   Calcium 8.0 (L) 8.9 - 10.3 mg/dL   GFR calc non Af Amer >60 >60 mL/min   GFR calc Af Amer >60 >60 mL/min   Anion gap 7 5 - 15  Magnesium     Status: None   Collection Time: 02/24/19 11:15 PM  Result Value Ref Range   Magnesium 1.9 1.7 - 2.4 mg/dL  Glucose, capillary     Status: Abnormal   Collection Time: 02/25/19  3:12 AM  Result Value Ref Range   Glucose-Capillary 133 (H) 70 - 99 mg/dL  Triglycerides     Status: None   Collection Time: 02/25/19  4:59 AM  Result Value Ref Range   Triglycerides 62 <150 mg/dL  Basic metabolic panel     Status: Abnormal   Collection Time: 02/25/19  4:59 AM  Result Value Ref Range   Sodium 140 135 - 145 mmol/L   Potassium 3.8 3.5 - 5.1 mmol/L   Chloride 107 98 - 111 mmol/L   CO2 23 22 - 32 mmol/L   Glucose,  Bld 135 (H) 70 - 99 mg/dL   BUN 20 8 - 23 mg/dL   Creatinine, Ser 1.61 (L) 0.61 - 1.24 mg/dL   Calcium 8.1 (L) 8.9 - 10.3 mg/dL   GFR calc non Af Amer >60 >60 mL/min   GFR calc Af Amer >60 >60 mL/min   Anion gap 10 5 - 15  Glucose, capillary     Status: Abnormal   Collection Time: 02/25/19  7:56 AM  Result Value Ref Range   Glucose-Capillary 111 (H) 70 - 99 mg/dL    Assessment & Plan: Present on Admission: . Subdural hematoma (HCC)    LOS: 6 days   Additional comments:I reviewed the patient's new clinical lab test results. . 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 Likely will need trach. Will discuss with family. ABL anemia  ID - Vanc/cefepime empiric, urine CX 5/30 10k staph, resp CX with GNR - pending [place] collar for now Hyperglycemia - SSI FEN - TF, Na normal VTE - PAS Dispo - ICU Critical Care Total Time*: 33 Minutes  Violeta Gelinas, MD, MPH, FACS Trauma & General Surgery: [phone]  02/25/2019  *Care during the described time interval was provided by me. I have reviewed this patient's available data, including medical history, events of note, physical examination and test results as part of my evaluation.
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
## 2019-02-25 DEATH — deceased

## 2019-02-26 LAB — GLUCOSE, CAPILLARY
Glucose-Capillary: 113 mg/dL — ABNORMAL HIGH (ref 70–99)
Glucose-Capillary: 117 mg/dL — ABNORMAL HIGH (ref 70–99)
Glucose-Capillary: 124 mg/dL — ABNORMAL HIGH (ref 70–99)
Glucose-Capillary: 131 mg/dL — ABNORMAL HIGH (ref 70–99)
Glucose-Capillary: 139 mg/dL — ABNORMAL HIGH (ref 70–99)
Glucose-Capillary: 165 mg/dL — ABNORMAL HIGH (ref 70–99)

## 2019-02-26 LAB — CULTURE, BLOOD (ROUTINE X 2): Special Requests: ADEQUATE

## 2019-02-26 LAB — TRIGLYCERIDES: Triglycerides: 89 mg/dL (ref <150)

## 2019-02-26 MED ORDER — PANTOPRAZOLE SODIUM 40 MG PO PACK
40.0000 mg | PACK | Freq: Every day | ORAL | Status: DC
  Administered 2019-02-26 – 2019-03-04 (×6): 40 mg
  Filled 2019-02-26 (×6): qty 20

## 2019-02-26 MED ORDER — SODIUM CHLORIDE 0.9 % IV SOLN
2.0000 g | INTRAVENOUS | Status: AC
  Administered 2019-02-26 – 2019-03-01 (×4): 2 g via INTRAVENOUS
  Filled 2019-02-26: qty 2
  Filled 2019-02-26 (×3): qty 20

## 2019-02-26 NOTE — Progress Notes (Signed)
Nutrition Follow-up  DOCUMENTATION CODES:   Not applicable  INTERVENTION:   Pivot 1.5 @ 45 ml/hr 30 ml Prostat BID  Provides: 1640 kcal, 120 grams protein, and 728 ml free water.  TF regimen and propofol at current rate providing 1699 total kcal/day (98 % of kcal needs)   NUTRITION DIAGNOSIS:   Increased nutrient needs related to (TBI) as evidenced by estimated needs.  GOAL:   Patient will meet greater than or equal to 90% of their needs  MONITOR:   Vent status, TF tolerance  REASON FOR ASSESSMENT:   Consult, Ventilator Enteral/tube feeding initiation and management  ASSESSMENT:   Pt found down with TBI/B SDH/SAH/B temporal ICC/parietal skull fx, pneumocephalus.    Per trauma will likely need trach.   Patient is currently intubated on ventilator support MV: 11 L/min Temp (24hrs), Avg:98.9 F (37.2 C), Min:98.3 F (36.8 C), Max:99.2 F (37.3 C)  Propofol: 2.25 ml/hr (5 mcg) = 59 kcal  Medications reviewed and include: vitamin C every 8 hours, SSI NS with 20 mEq KCl/L @ 100 ml/hr Labs reviewed Pt is positive 12 L since admission   NUTRITION - FOCUSED PHYSICAL EXAM:  Deferred   Diet Order:   Diet Order            Diet NPO time specified  Diet effective now              EDUCATION NEEDS:   No education needs have been identified at this time  Skin:  Skin Assessment: Reviewed RN Assessment  Last BM:  unknown  Height:   Ht Readings from Last 1 Encounters:  02/19/19 5\' 9"  (1.753 m)    Weight:   Wt Readings from Last 1 Encounters:  02/26/19 74.2 kg    Ideal Body Weight:  72.7 kg  BMI:  Body mass index is 24.16 kg/m.  Estimated Nutritional Needs:   Kcal:  1731  Protein:  112-125 grams  Fluid:  > 1.7 L/day  Kendell Bane RD, LDN, CNSC [phone] Pager [phone] After Hours Pager

## 2019-02-26 NOTE — Progress Notes (Signed)
Patient ID: Francis Mendoza, male   DOB: 24-Jun-1946, 73 y.o.   MRN: [id number] Follow up - Trauma Critical Care  Patient Details:    Francis Mendoza is an 73 y.o. male.  Lines/tubes : Airway 7.5 mm (Active)  Secured at (cm) 24 cm 02/26/2019  3:20 AM  Measured From Lips 02/26/2019  3:20 AM  Secured Location Right 02/26/2019  3:20 AM  Secured By [employer] 02/26/2019  3:20 AM  Tube Holder Repositioned Yes 02/26/2019  3:20 AM  Cuff Pressure (cm H2O) 22 cm H2O 02/26/2019  3:20 AM  Site Condition Dry 02/26/2019  3:20 AM     NG/OG Tube Orogastric Center mouth Xray (Active)  External Length of Tube (cm) - (if applicable) 42 cm 02/25/2019  8:00 PM  Site Assessment Clean;Dry;Intact 02/25/2019  8:00 PM  Ongoing Placement Verification Xray;No acute changes, not attributed to clinical condition;No change in respiratory status;No change in cm markings or external length of tube from initial placement 02/25/2019  8:00 PM  Status Infusing tube feed 02/25/2019  8:00 PM     External Urinary Catheter (Active)  Collection Container Standard drainage bag 02/25/2019  8:00 PM  Securement Method Leg strap 02/25/2019  8:00 PM  Output (mL) 125 mL 02/26/2019  4:00 AM    Microbiology/Sepsis markers: Results for orders placed or performed during the hospital encounter of Mar 06, 2019  SARS Coronavirus 2 (CEPHEID- Performed in [facility] hospital lab), Hosp Order     Status: None   Collection Time: March 06, 2019 11:57 PM  Result Value Ref Range Status   SARS Coronavirus 2 NEGATIVE NEGATIVE Final    Comment: (NOTE) If result is NEGATIVE SARS-CoV-2 target nucleic acids are NOT DETECTED. The SARS-CoV-2 RNA is generally detectable in upper and lower  respiratory specimens during the acute phase of infection. The lowest  concentration of SARS-CoV-2 viral copies this assay can detect is 250  copies / mL. A negative result does not preclude SARS-CoV-2 infection  and should not be used as the sole basis for treatment or other   patient management decisions.  A negative result may occur with  improper specimen collection / handling, submission of specimen other  than nasopharyngeal swab, presence of viral mutation(s) within the  areas targeted by this assay, and inadequate number of viral copies  (<250 copies / mL). A negative result must be combined with clinical  observations, patient history, and epidemiological information. If result is POSITIVE SARS-CoV-2 target nucleic acids are DETECTED. The SARS-CoV-2 RNA is generally detectable in upper and lower  respiratory specimens dur ing the acute phase of infection.  Positive  results are indicative of active infection with SARS-CoV-2.  Clinical  correlation with patient history and other diagnostic information is  necessary to determine patient infection status.  Positive results do  not rule out bacterial infection or co-infection with other viruses. If result is PRESUMPTIVE POSTIVE SARS-CoV-2 nucleic acids MAY BE PRESENT.   A presumptive positive result was obtained on the submitted specimen  and confirmed on repeat testing.  While 2019 novel coronavirus  (SARS-CoV-2) nucleic acids may be present in the submitted sample  additional confirmatory testing may be necessary for epidemiological  and / or clinical management purposes  to differentiate between  SARS-CoV-2 and other Sarbecovirus currently known to infect humans.  If clinically indicated additional testing with an alternate test  methodology [phone]) is advised. The SARS-CoV-2 RNA is generally  detectable in upper and lower respiratory sp ecimens during the acute  phase of infection. The  expected result is Negative. Fact Sheet for Patients:  BoilerBrush.com.cyhttps://www.fda.gov/media/136312/download Fact Sheet for Healthcare Providers: https://pope.com/https://www.fda.gov/media/136313/download This test is not yet approved or cleared by the [place] FDA and has been authorized for detection and/or diagnosis of SARS-CoV-2  by FDA under an Emergency Use Authorization (EUA).  This EUA will remain in effect (meaning this test can be used) for the duration of the COVID-19 declaration under Section 564(b)(1) of the Act, 21 U.S.C. section 360bbb-3(b)(1), unless the authorization is terminated or revoked sooner. Performed at [facility], [address] [place], [place] [address]   MRSA PCR Screening     Status: None   Collection Time: 02/19/19  1:19 AM  Result Value Ref Range Status   MRSA by PCR NEGATIVE NEGATIVE Final    Comment:        The GeneXpert MRSA Assay (FDA approved for NASAL specimens only), is one component of a comprehensive MRSA colonization surveillance program. It is not intended to diagnose MRSA infection nor to guide or monitor treatment for MRSA infections. Performed at [facility], [address] [place], [place] [address]   Culture, blood (Routine X 2) w Reflex to ID Panel     Status: Abnormal   Collection Time: 02/23/19 11:56 AM  Result Value Ref Range Status   Specimen Description BLOOD LEFT HAND  Final   Special Requests   Final    BOTTLES DRAWN AEROBIC ONLY Blood Culture adequate volume   Culture  Setup Time   Final    GRAM POSITIVE COCCI IN CLUSTERS AEROBIC BOTTLE ONLY CRITICAL RESULT CALLED TO, READ BACK BY AND VERIFIED WITH: PHARMD R. RUMMBARGER [phone] FCP    Culture (A)  Final    STAPHYLOCOCCUS SPECIES (COAGULASE NEGATIVE) THE SIGNIFICANCE OF ISOLATING THIS ORGANISM FROM A SINGLE SET OF BLOOD CULTURES WHEN MULTIPLE SETS ARE DRAWN IS UNCERTAIN. PLEASE NOTIFY THE MICROBIOLOGY DEPARTMENT WITHIN ONE WEEK IF SPECIATION AND SENSITIVITIES ARE REQUIRED. Performed at [facility], [address] [place], [place] [address]    Report Status 02/26/2019 FINAL  Final  Blood Culture ID Panel (Reflexed)     Status: Abnormal   Collection Time: 02/23/19 11:56 AM  Result Value Ref Range Status   Enterococcus species NOT DETECTED NOT DETECTED Final   Listeria  monocytogenes NOT DETECTED NOT DETECTED Final   Staphylococcus species DETECTED (A) NOT DETECTED Final    Comment: Methicillin (oxacillin) susceptible coagulase negative staphylococcus. Possible blood culture contaminant (unless isolated from more than one blood culture draw or clinical case suggests pathogenicity). No antibiotic treatment is indicated for blood  culture contaminants. CRITICAL RESULT CALLED TO, READ BACK BY AND VERIFIED WITH: PHARMD R. RUMMBARGER 1529 [phone] FCP    Staphylococcus aureus (BCID) NOT DETECTED NOT DETECTED Final   Methicillin resistance NOT DETECTED NOT DETECTED Final   Streptococcus species NOT DETECTED NOT DETECTED Final   Streptococcus agalactiae NOT DETECTED NOT DETECTED Final   Streptococcus pneumoniae NOT DETECTED NOT DETECTED Final   Streptococcus pyogenes NOT DETECTED NOT DETECTED Final   Acinetobacter baumannii NOT DETECTED NOT DETECTED Final   Enterobacteriaceae species NOT DETECTED NOT DETECTED Final   Enterobacter cloacae complex NOT DETECTED NOT DETECTED Final   Escherichia coli NOT DETECTED NOT DETECTED Final   Klebsiella oxytoca NOT DETECTED NOT DETECTED Final   Klebsiella pneumoniae NOT DETECTED NOT DETECTED Final   Proteus species NOT DETECTED NOT DETECTED Final   Serratia marcescens NOT DETECTED NOT DETECTED Final   Haemophilus influenzae NOT DETECTED NOT  DETECTED Final   Neisseria meningitidis NOT DETECTED NOT DETECTED Final   Pseudomonas aeruginosa NOT DETECTED NOT DETECTED Final   Candida albicans NOT DETECTED NOT DETECTED Final   Candida glabrata NOT DETECTED NOT DETECTED Final   Candida krusei NOT DETECTED NOT DETECTED Final   Candida parapsilosis NOT DETECTED NOT DETECTED Final   Candida tropicalis NOT DETECTED NOT DETECTED Final    Comment: Performed at [facility], [address] [place], [place] [address]  Culture, blood (Routine X 2) w Reflex to ID Panel     Status: None (Preliminary result)   Collection Time: 02/23/19  12:02 PM  Result Value Ref Range Status   Specimen Description BLOOD RIGHT HAND  Final   Special Requests   Final    BOTTLES DRAWN AEROBIC ONLY Blood Culture adequate volume   Culture   Final    NO GROWTH 3 DAYS Performed at [facility], [address] [place], [place] [address]    Report Status PENDING  Incomplete  Culture, Urine     Status: Abnormal   Collection Time: 02/23/19 12:10 PM  Result Value Ref Range Status   Specimen Description URINE, RANDOM  Final   Special Requests   Final    NONE Performed at [facility], [address] [place], [place] [address]    Culture 10,000 COLONIES/mL STAPHYLOCOCCUS CAPITIS (A)  Final   Report Status 02/25/2019 FINAL  Final   Organism ID, Bacteria STAPHYLOCOCCUS CAPITIS (A)  Final      Susceptibility   Staphylococcus capitis - MIC*    CIPROFLOXACIN <=0.5 SENSITIVE Sensitive     GENTAMICIN <=0.5 SENSITIVE Sensitive     NITROFURANTOIN <=16 SENSITIVE Sensitive     OXACILLIN <=0.25 SENSITIVE Sensitive     TETRACYCLINE <=1 SENSITIVE Sensitive     VANCOMYCIN 1 SENSITIVE Sensitive     TRIMETH/SULFA <=10 SENSITIVE Sensitive     CLINDAMYCIN <=0.25 SENSITIVE Sensitive     RIFAMPIN <=0.5 SENSITIVE Sensitive     Inducible Clindamycin NEGATIVE Sensitive     * 10,000 COLONIES/mL STAPHYLOCOCCUS CAPITIS  Culture, respiratory (non-expectorated)     Status: None (Preliminary result)   Collection Time: 02/23/19 12:17 PM  Result Value Ref Range Status   Specimen Description TRACHEAL ASPIRATE  Final   Special Requests NONE  Final   Gram Stain   Final    RARE WBC PRESENT, PREDOMINANTLY PMN MODERATE GRAM POSITIVE COCCI IN PAIRS FEW GRAM NEGATIVE RODS    Culture   Final    MODERATE SERRATIA MARCESCENS MODERATE STREPTOCOCCUS PNEUMONIAE SUSCEPTIBILITIES TO FOLLOW Performed at [facility], [address] [place], [place] [address]    Report Status PENDING  Incomplete   Organism ID, Bacteria SERRATIA MARCESCENS  Final       Susceptibility   Serratia marcescens - MIC*    CEFAZOLIN >=64 RESISTANT Resistant     CEFEPIME <=1 SENSITIVE Sensitive     CEFTAZIDIME <=1 SENSITIVE Sensitive     CEFTRIAXONE <=1 SENSITIVE Sensitive     CIPROFLOXACIN <=0.25 SENSITIVE Sensitive     GENTAMICIN <=1 SENSITIVE Sensitive     TRIMETH/SULFA <=20 SENSITIVE Sensitive     * MODERATE SERRATIA MARCESCENS    Anti-infectives:  Anti-infectives (From admission, onward)   Start     Dose/Rate Route Frequency Ordered Stop   02/24/19 0200  vancomycin (VANCOCIN) IVPB 1000 mg/200 mL premix     1,000 mg 200 mL/hr over 60 Minutes Intravenous Every 12 hours 02/23/19 1224  02/23/19 1400  vancomycin (VANCOCIN) 1,500 mg in sodium chloride 0.9 % 500 mL IVPB     1,500 mg 250 mL/hr over 120 Minutes Intravenous  Once 02/23/19 1224 02/23/19 1803   02/23/19 1300  ceFEPIme (MAXIPIME) 2 g in sodium chloride 0.9 % 100 mL IVPB     2 g 200 mL/hr over 30 Minutes Intravenous Every 8 hours 02/23/19 1224        Best Practice/Protocols:  VTE Prophylaxis: Mechanical Continous Sedation  Consults: Treatment Team:  Jadene Pierinistergard, Thomas A, MD   Subjective:    Overnight Issues:   Objective:  Vital signs for last 24 hours: Temp:  [97.4 F (36.3 C)-99.2 F (37.3 C)] 99.1 F (37.3 C) (06/02 0400) Pulse Rate:  [57-102] 91 (06/02 0600) Resp:  [17-33] 31 (06/02 0600) BP: (99-169)/(50-86) 127/63 (06/02 0600) SpO2:  [95 %-100 %] 97 % (06/02 0600) FiO2 (%):  [40 %] 40 % (06/02 0400) Weight:  [74.2 kg] 74.2 kg (06/02 0500)  Hemodynamic parameters for last 24 hours:    Intake/Output from previous day: 06/01 0701 - 06/02 0700 In: 3960.3 [I.V.:2056.8; NG/GT:1035; IV Piggyback:868.4] Out: 2335 [Urine:2335]  Intake/Output this shift: No intake/output data recorded.  Vent settings for last 24 hours: Vent Mode: PRVC FiO2 (%):  [40 %] 40 % Set Rate:  [14 bmp] 14 bmp Vt Set:  [560 mL] 560 mL PEEP:  [5 cmH20] 5 cmH20 Pressure Support:  [5 cmH20-8  cmH20] 8 cmH20 Plateau Pressure:  [1 cmH20-20 cmH20] 19 cmH20  Physical Exam:  General: on vent Neuro: localizes with LUE, small movement RUE HEENT/Neck: ETT Resp: clear to auscultation bilaterally CVS: RRR GI: soft, NT, ND Extremities: good pulses  Results for orders placed or performed during the hospital encounter of 01/26/2019 (from the past 24 hour(s))  Glucose, capillary     Status: Abnormal   Collection Time: 02/25/19  7:56 AM  Result Value Ref Range   Glucose-Capillary 111 (H) 70 - 99 mg/dL  Glucose, capillary     Status: Abnormal   Collection Time: 02/25/19 11:57 AM  Result Value Ref Range   Glucose-Capillary 124 (H) 70 - 99 mg/dL   Comment 1 Notify RN    Comment 2 Document in Chart   Glucose, capillary     Status: Abnormal   Collection Time: 02/25/19  3:30 PM  Result Value Ref Range   Glucose-Capillary 130 (H) 70 - 99 mg/dL   Comment 1 Notify RN    Comment 2 Document in Chart   Glucose, capillary     Status: Abnormal   Collection Time: 02/25/19  7:29 PM  Result Value Ref Range   Glucose-Capillary 122 (H) 70 - 99 mg/dL  Glucose, capillary     Status: Abnormal   Collection Time: 02/25/19 11:09 PM  Result Value Ref Range   Glucose-Capillary 121 (H) 70 - 99 mg/dL  Triglycerides     Status: None   Collection Time: 02/26/19  1:26 AM  Result Value Ref Range   Triglycerides 89 <150 mg/dL  Glucose, capillary     Status: Abnormal   Collection Time: 02/26/19  3:16 AM  Result Value Ref Range   Glucose-Capillary 165 (H) 70 - 99 mg/dL  Glucose, capillary     Status: Abnormal   Collection Time: 02/26/19  7:32 AM  Result Value Ref Range   Glucose-Capillary 124 (H) 70 - 99 mg/dL   Comment 1 Notify RN    Comment 2 Document in Chart     Assessment & Plan: Present  on Admission: . Subdural hematoma (HCC)    LOS: 7 days   Additional comments:I reviewed the patient's new clinical lab test results. . Found down TBI/B SDH/SAH/B temporal ICC/parietal skull FX/temporal  infarct/pneumocephalus - less movement RUE, Dr. Maurice Small following Acute hypoxic ventilator dependent respiratory failure - weaning great on 5/5 but MS does not allow extubation now. Likely will need trach. Will discuss with family. ABL anemia  ID - Vanc/cefepime empiric, resp CX serratia and strep - final result pending [place] collar for now Hyperglycemia - SSI FEN - TF, labs in AM VTE - PAS Dispo - ICU, plan discussion with family Critical Care Total Time*: 87 Minutes  Violeta Gelinas, MD, MPH, FACS Trauma & General Surgery: [phone]  02/26/2019  *Care during the described time interval was provided by me. I have reviewed this patient's available data, including medical history, events of note, physical examination and test results as part of my evaluation.
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
## 2019-02-26 NOTE — Plan of Care (Signed)
Pt unable to participate in ADLs.

## 2019-02-27 LAB — CBC
HCT: 29.6 % — ABNORMAL LOW (ref 39.0–52.0)
Hemoglobin: 9.7 g/dL — ABNORMAL LOW (ref 13.0–17.0)
MCH: 28.8 pg (ref 26.0–34.0)
MCHC: 32.8 g/dL (ref 30.0–36.0)
MCV: 87.8 fL (ref 80.0–100.0)
Platelets: 323 10*3/uL (ref 150–400)
RBC: 3.37 MIL/uL — ABNORMAL LOW (ref 4.22–5.81)
RDW: 13.9 % (ref 11.5–15.5)
WBC: 17.3 10*3/uL — ABNORMAL HIGH (ref 4.0–10.5)
nRBC: 0 % (ref 0.0–0.2)

## 2019-02-27 LAB — CULTURE, RESPIRATORY W GRAM STAIN

## 2019-02-27 LAB — BASIC METABOLIC PANEL
Anion gap: 10 (ref 5–15)
BUN: 24 mg/dL — ABNORMAL HIGH (ref 8–23)
CO2: 23 mmol/L (ref 22–32)
Calcium: 7.8 mg/dL — ABNORMAL LOW (ref 8.9–10.3)
Chloride: 105 mmol/L (ref 98–111)
Creatinine, Ser: 0.43 mg/dL — ABNORMAL LOW (ref 0.61–1.24)
GFR calc Af Amer: >60 mL/min (ref >60)
GFR calc non Af Amer: >60 mL/min (ref >60)
Glucose, Bld: 128 mg/dL — ABNORMAL HIGH (ref 70–99)
Potassium: 4 mmol/L (ref 3.5–5.1)
Sodium: 138 mmol/L (ref 135–145)

## 2019-02-27 LAB — GLUCOSE, CAPILLARY
Glucose-Capillary: 113 mg/dL — ABNORMAL HIGH (ref 70–99)
Glucose-Capillary: 116 mg/dL — ABNORMAL HIGH (ref 70–99)
Glucose-Capillary: 118 mg/dL — ABNORMAL HIGH (ref 70–99)
Glucose-Capillary: 119 mg/dL — ABNORMAL HIGH (ref 70–99)
Glucose-Capillary: 121 mg/dL — ABNORMAL HIGH (ref 70–99)
Glucose-Capillary: 94 mg/dL (ref 70–99)

## 2019-02-27 LAB — TRIGLYCERIDES: Triglycerides: 64 mg/dL (ref <150)

## 2019-02-27 NOTE — Progress Notes (Signed)
Called to patients room by RN patients heart rate continues to increase.  Patient has been weaning on ventilator all day and has done well.  Patient is now tiring out and ready for full support to rest.  Patient placed back on full support setting tolerating well.  RT will continue to monitor and assess.

## 2019-02-27 NOTE — Plan of Care (Signed)
Pt able to tolerate q 2hr turns. Pt able to wean on ventilator during the day.

## 2019-02-27 NOTE — Progress Notes (Addendum)
Patient ID: Francis Mendoza, male   DOB: 24-Jun-1946, 73 y.o.   MRN: [id number] Follow up - Trauma Critical Care  Patient Details:    Francis Mendoza is an 73 y.o. male.  Lines/tubes : Airway 7.5 mm (Active)  Secured at (cm) 24 cm 02/26/2019  3:20 AM  Measured From Lips 02/26/2019  3:20 AM  Secured Location Right 02/26/2019  3:20 AM  Secured By [employer] 02/26/2019  3:20 AM  Tube Holder Repositioned Yes 02/26/2019  3:20 AM  Cuff Pressure (cm H2O) 22 cm H2O 02/26/2019  3:20 AM  Site Condition Dry 02/26/2019  3:20 AM     NG/OG Tube Orogastric Center mouth Xray (Active)  External Length of Tube (cm) - (if applicable) 42 cm 02/25/2019  8:00 PM  Site Assessment Clean;Dry;Intact 02/25/2019  8:00 PM  Ongoing Placement Verification Xray;No acute changes, not attributed to clinical condition;No change in respiratory status;No change in cm markings or external length of tube from initial placement 02/25/2019  8:00 PM  Status Infusing tube feed 02/25/2019  8:00 PM     External Urinary Catheter (Active)  Collection Container Standard drainage bag 02/25/2019  8:00 PM  Securement Method Leg strap 02/25/2019  8:00 PM  Output (mL) 125 mL 02/26/2019  4:00 AM    Microbiology/Sepsis markers: Results for orders placed or performed during the hospital encounter of Mar 06, 2019  SARS Coronavirus 2 (CEPHEID- Performed in [facility] hospital lab), Hosp Order     Status: None   Collection Time: March 06, 2019 11:57 PM  Result Value Ref Range Status   SARS Coronavirus 2 NEGATIVE NEGATIVE Final    Comment: (NOTE) If result is NEGATIVE SARS-CoV-2 target nucleic acids are NOT DETECTED. The SARS-CoV-2 RNA is generally detectable in upper and lower  respiratory specimens during the acute phase of infection. The lowest  concentration of SARS-CoV-2 viral copies this assay can detect is 250  copies / mL. A negative result does not preclude SARS-CoV-2 infection  and should not be used as the sole basis for treatment or other   patient management decisions.  A negative result may occur with  improper specimen collection / handling, submission of specimen other  than nasopharyngeal swab, presence of viral mutation(s) within the  areas targeted by this assay, and inadequate number of viral copies  (<250 copies / mL). A negative result must be combined with clinical  observations, patient history, and epidemiological information. If result is POSITIVE SARS-CoV-2 target nucleic acids are DETECTED. The SARS-CoV-2 RNA is generally detectable in upper and lower  respiratory specimens dur ing the acute phase of infection.  Positive  results are indicative of active infection with SARS-CoV-2.  Clinical  correlation with patient history and other diagnostic information is  necessary to determine patient infection status.  Positive results do  not rule out bacterial infection or co-infection with other viruses. If result is PRESUMPTIVE POSTIVE SARS-CoV-2 nucleic acids MAY BE PRESENT.   A presumptive positive result was obtained on the submitted specimen  and confirmed on repeat testing.  While 2019 novel coronavirus  (SARS-CoV-2) nucleic acids may be present in the submitted sample  additional confirmatory testing may be necessary for epidemiological  and / or clinical management purposes  to differentiate between  SARS-CoV-2 and other Sarbecovirus currently known to infect humans.  If clinically indicated additional testing with an alternate test  methodology [phone]) is advised. The SARS-CoV-2 RNA is generally  detectable in upper and lower respiratory sp ecimens during the acute  phase of infection. The  expected result is Negative. Fact Sheet for Patients:  BoilerBrush.com.cyhttps://www.fda.gov/media/136312/download Fact Sheet for Healthcare Providers: https://pope.com/https://www.fda.gov/media/136313/download This test is not yet approved or cleared by the [place] FDA and has been authorized for detection and/or diagnosis of SARS-CoV-2  by FDA under an Emergency Use Authorization (EUA).  This EUA will remain in effect (meaning this test can be used) for the duration of the COVID-19 declaration under Section 564(b)(1) of the Act, 21 U.S.C. section 360bbb-3(b)(1), unless the authorization is terminated or revoked sooner. Performed at [facility], [address] [place], [place] [address]   MRSA PCR Screening     Status: None   Collection Time: 02/19/19  1:19 AM  Result Value Ref Range Status   MRSA by PCR NEGATIVE NEGATIVE Final    Comment:        The GeneXpert MRSA Assay (FDA approved for NASAL specimens only), is one component of a comprehensive MRSA colonization surveillance program. It is not intended to diagnose MRSA infection nor to guide or monitor treatment for MRSA infections. Performed at [facility], [address] [place], [place] [address]   Culture, blood (Routine X 2) w Reflex to ID Panel     Status: Abnormal   Collection Time: 02/23/19 11:56 AM  Result Value Ref Range Status   Specimen Description BLOOD LEFT HAND  Final   Special Requests   Final    BOTTLES DRAWN AEROBIC ONLY Blood Culture adequate volume   Culture  Setup Time   Final    GRAM POSITIVE COCCI IN CLUSTERS AEROBIC BOTTLE ONLY CRITICAL RESULT CALLED TO, READ BACK BY AND VERIFIED WITH: PHARMD R. RUMMBARGER 1529 [id number] FCP    Culture (A)  Final    STAPHYLOCOCCUS SPECIES (COAGULASE NEGATIVE) THE SIGNIFICANCE OF ISOLATING THIS ORGANISM FROM A SINGLE SET OF BLOOD CULTURES WHEN MULTIPLE SETS ARE DRAWN IS UNCERTAIN. PLEASE NOTIFY THE MICROBIOLOGY DEPARTMENT WITHIN ONE WEEK IF SPECIATION AND SENSITIVITIES ARE REQUIRED. Performed at [facility] [place] [facility], [address] [place], [place] [address]    Report Status 02/26/2019 FINAL  Final  Blood Culture ID Panel (Reflexed)     Status: Abnormal   Collection Time: 02/23/19 11:56 AM  Result Value Ref Range Status   Enterococcus species NOT DETECTED NOT DETECTED Final   Listeria  monocytogenes NOT DETECTED NOT DETECTED Final   Staphylococcus species DETECTED (A) NOT DETECTED Final    Comment: Methicillin (oxacillin) susceptible coagulase negative staphylococcus. Possible blood culture contaminant (unless isolated from more than one blood culture draw or clinical case suggests pathogenicity). No antibiotic treatment is indicated for blood  culture contaminants. CRITICAL RESULT CALLED TO, READ BACK BY AND VERIFIED WITH: PHARMD R. RUMMBARGER 1529 [id number] FCP    Staphylococcus aureus (BCID) NOT DETECTED NOT DETECTED Final   Methicillin resistance NOT DETECTED NOT DETECTED Final   Streptococcus species NOT DETECTED NOT DETECTED Final   Streptococcus agalactiae NOT DETECTED NOT DETECTED Final   Streptococcus pneumoniae NOT DETECTED NOT DETECTED Final   Streptococcus pyogenes NOT DETECTED NOT DETECTED Final   Acinetobacter baumannii NOT DETECTED NOT DETECTED Final   Enterobacteriaceae species NOT DETECTED NOT DETECTED Final   Enterobacter cloacae complex NOT DETECTED NOT DETECTED Final   Escherichia coli NOT DETECTED NOT DETECTED Final   Klebsiella oxytoca NOT DETECTED NOT DETECTED Final   Klebsiella pneumoniae NOT DETECTED NOT DETECTED Final   Proteus species NOT DETECTED NOT DETECTED Final   Serratia marcescens NOT DETECTED NOT DETECTED Final   Haemophilus influenzae NOT DETECTED NOT  DETECTED Final   Neisseria meningitidis NOT DETECTED NOT DETECTED Final   Pseudomonas aeruginosa NOT DETECTED NOT DETECTED Final   Candida albicans NOT DETECTED NOT DETECTED Final   Candida glabrata NOT DETECTED NOT DETECTED Final   Candida krusei NOT DETECTED NOT DETECTED Final   Candida parapsilosis NOT DETECTED NOT DETECTED Final   Candida tropicalis NOT DETECTED NOT DETECTED Final    Comment: Performed at [facility], [address], [place], [place] [address]  Culture, blood (Routine X 2) w Reflex to ID Panel     Status: None (Preliminary result)   Collection Time: 02/23/19  12:02 PM  Result Value Ref Range Status   Specimen Description BLOOD RIGHT HAND  Final   Special Requests   Final    BOTTLES DRAWN AEROBIC ONLY Blood Culture adequate volume   Culture   Final    NO GROWTH 4 DAYS Performed at [facility], [address] [place], [place] [address]    Report Status PENDING  Incomplete  Culture, Urine     Status: Abnormal   Collection Time: 02/23/19 12:10 PM  Result Value Ref Range Status   Specimen Description URINE, RANDOM  Final   Special Requests   Final    NONE Performed at [facility], [address], [place], [place] [address]    Culture 10,000 COLONIES/mL STAPHYLOCOCCUS CAPITIS (A)  Final   Report Status 02/25/2019 FINAL  Final   Organism ID, Bacteria STAPHYLOCOCCUS CAPITIS (A)  Final      Susceptibility   Staphylococcus capitis - MIC*    CIPROFLOXACIN <=0.5 SENSITIVE Sensitive     GENTAMICIN <=0.5 SENSITIVE Sensitive     NITROFURANTOIN <=16 SENSITIVE Sensitive     OXACILLIN <=0.25 SENSITIVE Sensitive     TETRACYCLINE <=1 SENSITIVE Sensitive     VANCOMYCIN 1 SENSITIVE Sensitive     TRIMETH/SULFA <=10 SENSITIVE Sensitive     CLINDAMYCIN <=0.25 SENSITIVE Sensitive     RIFAMPIN <=0.5 SENSITIVE Sensitive     Inducible Clindamycin NEGATIVE Sensitive     * 10,000 COLONIES/mL STAPHYLOCOCCUS CAPITIS  Culture, respiratory (non-expectorated)     Status: None   Collection Time: 02/23/19 12:17 PM  Result Value Ref Range Status   Specimen Description TRACHEAL ASPIRATE  Final   Special Requests NONE  Final   Gram Stain   Final    RARE WBC PRESENT, PREDOMINANTLY PMN MODERATE GRAM POSITIVE COCCI IN PAIRS FEW GRAM NEGATIVE RODS Performed at [facility], [address], [place], [place] [address]    Culture   Final    MODERATE SERRATIA MARCESCENS MODERATE STREPTOCOCCUS PNEUMONIAE    Report Status 02/27/2019 FINAL  Final   Organism ID, Bacteria SERRATIA MARCESCENS  Final   Organism ID, Bacteria STREPTOCOCCUS PNEUMONIAE  Final       Susceptibility   Serratia marcescens - MIC*    CEFAZOLIN >=64 RESISTANT Resistant     CEFEPIME <=1 SENSITIVE Sensitive     CEFTAZIDIME <=1 SENSITIVE Sensitive     CEFTRIAXONE <=1 SENSITIVE Sensitive     CIPROFLOXACIN <=0.25 SENSITIVE Sensitive     GENTAMICIN <=1 SENSITIVE Sensitive     TRIMETH/SULFA <=20 SENSITIVE Sensitive     * MODERATE SERRATIA MARCESCENS   Streptococcus pneumoniae - MIC*    ERYTHROMYCIN >=8 RESISTANT Resistant     LEVOFLOXACIN 0.5 SENSITIVE Sensitive     VANCOMYCIN 0.5 SENSITIVE Sensitive     PENICILLIN (non-meningitis) <=0.06 SENSITIVE Sensitive     CEFTRIAXONE (non-meningitis) <=0.12 SENSITIVE  Sensitive     * MODERATE STREPTOCOCCUS PNEUMONIAE    Anti-infectives:  Anti-infectives (From admission, onward)   Start     Dose/Rate Route Frequency Ordered Stop   02/26/19 1400  cefTRIAXone (ROCEPHIN) 2 g in sodium chloride 0.9 % 100 mL IVPB     2 g 200 mL/hr over 30 Minutes Intravenous Every 24 hours 02/26/19 0908     02/24/19 0200  vancomycin (VANCOCIN) IVPB 1000 mg/200 mL premix  Status:  Discontinued     1,000 mg 200 mL/hr over 60 Minutes Intravenous Every 12 hours 02/23/19 1224 02/26/19 0908   02/23/19 1400  vancomycin (VANCOCIN) 1,500 mg in sodium chloride 0.9 % 500 mL IVPB     1,500 mg 250 mL/hr over 120 Minutes Intravenous  Once 02/23/19 1224 02/23/19 1803   02/23/19 1300  ceFEPIme (MAXIPIME) 2 g in sodium chloride 0.9 % 100 mL IVPB  Status:  Discontinued     2 g 200 mL/hr over 30 Minutes Intravenous Every 8 hours 02/23/19 1224 02/26/19 0908      Best Practice/Protocols:  VTE Prophylaxis: Mechanical Continous Sedation  Consults: Treatment Team:  Jadene Pierini, MD   Subjective:    Overnight Issues:   Objective:  Vital signs for last 24 hours: Temp:  [97.9 F (36.6 C)-99.6 F (37.6 C)] 97.9 F (36.6 C) (06/03 0802) Pulse Rate:  [60-170] 88 (06/03 0849) Resp:  [14-34] 24 (06/03 0849) BP: (100-181)/(46-83) 158/61 (06/03  0849) SpO2:  [87 %-100 %] 98 % (06/03 0849) FiO2 (%):  [40 %] 40 % (06/03 0849) Weight:  [75.7 kg] 75.7 kg (06/03 0500)  Hemodynamic parameters for last 24 hours:    Intake/Output from previous day: 06/02 0701 - 06/03 0700 In: 4023.6 [I.V.:2540.7; NG/GT:1125; IV Piggyback:357.9] Out: 2400 [Urine:2400]  Intake/Output this shift: No intake/output data recorded.  Vent settings for last 24 hours: Vent Mode: PSV;CPAP FiO2 (%):  [40 %] 40 % Set Rate:  [14 bmp] 14 bmp Vt Set:  [56 mL-560 mL] 560 mL PEEP:  [5 cmH20] 5 cmH20 Pressure Support:  [5 cmH20-10 cmH20] 10 cmH20 Plateau Pressure:  [14 cmH20-22 cmH20] 18 cmH20  Physical Exam:  General: on vent Neuro: localizes with LUE, small movement RUE HEENT/Neck: ETT Resp: clear to auscultation bilaterally CVS: RRR GI: soft, NT, ND Extremities: good pulses  Results for orders placed or performed during the hospital encounter of March 18, 2019 (from the past 24 hour(s))  Glucose, capillary     Status: Abnormal   Collection Time: 02/26/19 11:36 AM  Result Value Ref Range   Glucose-Capillary 139 (H) 70 - 99 mg/dL   Comment 1 Notify RN    Comment 2 Document in Chart   Glucose, capillary     Status: Abnormal   Collection Time: 02/26/19  3:33 PM  Result Value Ref Range   Glucose-Capillary 117 (H) 70 - 99 mg/dL   Comment 1 Notify RN    Comment 2 Document in Chart   Glucose, capillary     Status: Abnormal   Collection Time: 02/26/19  7:35 PM  Result Value Ref Range   Glucose-Capillary 131 (H) 70 - 99 mg/dL  Glucose, capillary     Status: Abnormal   Collection Time: 02/26/19 11:31 PM  Result Value Ref Range   Glucose-Capillary 113 (H) 70 - 99 mg/dL  Triglycerides     Status: None   Collection Time: 02/27/19  2:05 AM  Result Value Ref Range   Triglycerides 64 <150 mg/dL  CBC     Status:  Abnormal   Collection Time: 02/27/19  2:05 AM  Result Value Ref Range   WBC 17.3 (H) 4.0 - 10.5 K/uL   RBC 3.37 (L) 4.22 - 5.81 MIL/uL   Hemoglobin  9.7 (L) 13.0 - 17.0 g/dL   HCT 45.4 (L) 09.8 - 11.9 %   MCV 87.8 80.0 - 100.0 fL   MCH 28.8 26.0 - 34.0 pg   MCHC 32.8 30.0 - 36.0 g/dL   RDW 14.7 82.9 - 56.2 %   Platelets 323 150 - 400 K/uL   nRBC 0.0 0.0 - 0.2 %  Basic metabolic panel     Status: Abnormal   Collection Time: 02/27/19  2:05 AM  Result Value Ref Range   Sodium 138 135 - 145 mmol/L   Potassium 4.0 3.5 - 5.1 mmol/L   Chloride 105 98 - 111 mmol/L   CO2 23 22 - 32 mmol/L   Glucose, Bld 128 (H) 70 - 99 mg/dL   BUN 24 (H) 8 - 23 mg/dL   Creatinine, Ser 1.30 (L) 0.61 - 1.24 mg/dL   Calcium 7.8 (L) 8.9 - 10.3 mg/dL   GFR calc non Af Amer >60 >60 mL/min   GFR calc Af Amer >60 >60 mL/min   Anion gap 10 5 - 15  Glucose, capillary     Status: Abnormal   Collection Time: 02/27/19  3:22 AM  Result Value Ref Range   Glucose-Capillary 116 (H) 70 - 99 mg/dL  Glucose, capillary     Status: Abnormal   Collection Time: 02/27/19  7:32 AM  Result Value Ref Range   Glucose-Capillary 121 (H) 70 - 99 mg/dL    Assessment & Plan: Present on Admission: . Subdural hematoma (HCC)    LOS: 8 days   Additional comments:I reviewed the patient's new clinical lab test results. . Found down TBI/B SDH/SAH/B temporal ICC/parietal skull FX/temporal infarct/pneumocephalus - less movement RUE, Dr. Maurice Small following Acute hypoxic ventilator dependent respiratory failure - weaning great on 5/5 but MS does not allow extubation now. Likely will need trach. Will discuss with family. ABL anemia  ID - Vanc/cefepime empiric, changed to rocephin 6/2. resp CX serratia and strep sensitive to rocephin WBC increasing, afebrile [place] collar for now Hyperglycemia - SSI FEN - TF, labs in AM VTE - PAS Dispo - ICU, plan discussion with family  Critical Care Total Time*: 30 Minutes  Berna Bue MD FACS   02/27/2019  *Care during the described time interval was provided by me. I have reviewed this patient's available data, including medical  history, events of note, physical examination and test results as part of my evaluation.
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
## 2019-02-28 DIAGNOSIS — L8990 Pressure ulcer of unspecified site, unspecified stage: Secondary | ICD-10-CM

## 2019-02-28 LAB — BASIC METABOLIC PANEL
Anion gap: 8 (ref 5–15)
BUN: 22 mg/dL (ref 8–23)
CO2: 26 mmol/L (ref 22–32)
Calcium: 8.1 mg/dL — ABNORMAL LOW (ref 8.9–10.3)
Chloride: 107 mmol/L (ref 98–111)
Creatinine, Ser: 0.48 mg/dL — ABNORMAL LOW (ref 0.61–1.24)
GFR calc Af Amer: >60 mL/min (ref >60)
GFR calc non Af Amer: >60 mL/min (ref >60)
Glucose, Bld: 118 mg/dL — ABNORMAL HIGH (ref 70–99)
Potassium: 4.3 mmol/L (ref 3.5–5.1)
Sodium: 141 mmol/L (ref 135–145)

## 2019-02-28 LAB — CBC
HCT: 31.5 % — ABNORMAL LOW (ref 39.0–52.0)
Hemoglobin: 10 g/dL — ABNORMAL LOW (ref 13.0–17.0)
MCH: 28.2 pg (ref 26.0–34.0)
MCHC: 31.7 g/dL (ref 30.0–36.0)
MCV: 88.7 fL (ref 80.0–100.0)
Platelets: 330 10*3/uL (ref 150–400)
RBC: 3.55 MIL/uL — ABNORMAL LOW (ref 4.22–5.81)
RDW: 13.7 % (ref 11.5–15.5)
WBC: 17.3 10*3/uL — ABNORMAL HIGH (ref 4.0–10.5)
nRBC: 0 % (ref 0.0–0.2)

## 2019-02-28 LAB — GLUCOSE, CAPILLARY
Glucose-Capillary: 100 mg/dL — ABNORMAL HIGH (ref 70–99)
Glucose-Capillary: 131 mg/dL — ABNORMAL HIGH (ref 70–99)
Glucose-Capillary: 137 mg/dL — ABNORMAL HIGH (ref 70–99)
Glucose-Capillary: 108 mg/dL — ABNORMAL HIGH (ref 70–99)
Glucose-Capillary: 104 mg/dL — ABNORMAL HIGH (ref 70–99)
Glucose-Capillary: 110 mg/dL — ABNORMAL HIGH (ref 70–99)

## 2019-02-28 LAB — TRIGLYCERIDES: Triglycerides: 73 mg/dL (ref <150)

## 2019-02-28 LAB — MAGNESIUM: Magnesium: 2 mg/dL (ref 1.7–2.4)

## 2019-02-28 LAB — CULTURE, BLOOD (ROUTINE X 2)
Culture: NO GROWTH
Special Requests: ADEQUATE

## 2019-02-28 MED ORDER — FREE WATER
200.0000 mL | Freq: Three times a day (TID) | Status: DC
  Administered 2019-02-28 – 2019-03-04 (×10): 200 mL

## 2019-02-28 NOTE — Progress Notes (Signed)
Patient ID: Francis Mendoza, male   DOB: Dec 01, 1945, 73 y.o.   MRN: [id number] Mendoza Follow up - Trauma Critical Care  Patient Details:    Francis HousemanRalph Mendoza is an 73 y.o. male.  Lines/tubes : Airway 7.5 mm (Active)  Secured at (cm) 24 cm 02/28/2019  7:27 AM  Measured From Lips 02/28/2019  7:27 AM  Secured Location Center 02/28/2019  7:27 AM  Secured By [employer] Tube Holder 02/28/2019  7:27 AM  Tube Holder Repositioned Yes 02/28/2019  7:27 AM  Cuff Pressure (cm H2O) 28 cm H2O 02/28/2019  7:27 AM  Site Condition Dry 02/28/2019  7:27 AM     NG/OG Tube Orogastric Center mouth Xray (Active)  External Length of Tube (cm) - (if applicable) 42 cm 02/27/2019  8:00 PM  Site Assessment Clean;Dry;Intact 02/27/2019  8:00 PM  Ongoing Placement Verification No acute changes, not attributed to clinical condition;No change in respiratory status;No change in cm markings or external length of tube from initial placement 02/27/2019  8:00 PM  Status Infusing tube feed 02/27/2019  8:00 PM     External Urinary Catheter (Active)  Collection Container Standard drainage bag 02/27/2019  8:00 PM  Securement Method Leg strap 02/27/2019  8:00 PM  Output (mL) 300 mL 02/28/2019  5:00 AM    Microbiology/Sepsis markers: Results for orders placed or performed during the hospital encounter of 02/24/2019  SARS Coronavirus 2 (CEPHEID- Performed in [facility]), Hosp Order     Status: None   Collection Time: 02/21/2019 11:57 PM  Result Value Ref Range Status   SARS Coronavirus 2 NEGATIVE NEGATIVE Final    Comment: (NOTE) If result is NEGATIVE SARS-CoV-2 target nucleic acids are NOT DETECTED. The SARS-CoV-2 RNA is generally detectable in upper and lower  respiratory specimens during the acute phase of infection. The lowest  concentration of SARS-CoV-2 viral copies this assay can detect is 250  copies / mL. A negative result does not preclude SARS-CoV-2 infection  and should not be used as the sole basis for treatment or other  patient  management decisions.  A negative result may occur with  improper specimen collection / handling, submission of specimen other  than nasopharyngeal swab, presence of viral mutation(s) within the  areas targeted by this assay, and inadequate number of viral copies  (<250 copies / mL). A negative result must be combined with clinical  observations, patient history, and epidemiological information. If result is POSITIVE SARS-CoV-2 target nucleic acids are DETECTED. The SARS-CoV-2 RNA is generally detectable in upper and lower  respiratory specimens dur ing the acute phase of infection.  Positive  results are indicative of active infection with SARS-CoV-2.  Clinical  correlation with patient history and other diagnostic information is  necessary to determine patient infection status.  Positive results do  not rule out bacterial infection or co-infection with other viruses. If result is PRESUMPTIVE POSTIVE SARS-CoV-2 nucleic acids MAY BE PRESENT.   A presumptive positive result was obtained on the submitted specimen  and confirmed on repeat testing.  While 2019 novel coronavirus  (SARS-CoV-2) nucleic acids may be present in the submitted sample  additional confirmatory testing may be necessary for epidemiological  and / or clinical management purposes  to differentiate between  SARS-CoV-2 and other Sarbecovirus currently known to infect humans.  If clinically indicated additional testing with an alternate test  methodology [phone]([id number]) is advised. The SARS-CoV-2 RNA is generally  detectable in upper and lower respiratory sp ecimens during the acute  phase of infection. The  expected result is Negative. Fact Sheet for Patients:  BoilerBrush.com.cy Fact Sheet for Healthcare Providers: https://pope.com/ This test is not yet approved or cleared by the [place] FDA and has been authorized for detection and/or diagnosis of SARS-CoV-2 by FDA under  an Emergency Use Authorization (EUA).  This EUA will remain in effect (meaning this test can be used) for the duration of the COVID-19 declaration under Section 564(b)(1) of the Act, 21 U.S.C. section 360bbb-3(b)(1), unless the authorization is terminated or revoked sooner. Performed at [facility], [address] [place], [place] [address]   MRSA PCR Screening     Status: None   Collection Time: 02/19/19  1:19 AM  Result Value Ref Range Status   MRSA by PCR NEGATIVE NEGATIVE Final    Comment:        The GeneXpert MRSA Assay (FDA approved for NASAL specimens only), is one component of a comprehensive MRSA colonization surveillance program. It is not intended to diagnose MRSA infection nor to guide or monitor treatment for MRSA infections. Performed at [facility], [address], [place], [place] [address]   Culture, blood (Routine X 2) w Reflex to ID Panel     Status: Abnormal   Collection Time: 02/23/19 11:56 AM  Result Value Ref Range Status   Specimen Description BLOOD LEFT HAND  Final   Special Requests   Final    BOTTLES DRAWN AEROBIC ONLY Blood Culture adequate volume   Culture  Setup Time   Final    GRAM POSITIVE COCCI IN CLUSTERS AEROBIC BOTTLE ONLY CRITICAL RESULT CALLED TO, READ BACK BY AND VERIFIED WITH: PHARMD R. RUMMBARGER [id number] FCP    Culture (A)  Final    STAPHYLOCOCCUS SPECIES (COAGULASE NEGATIVE) THE SIGNIFICANCE OF ISOLATING THIS ORGANISM FROM A SINGLE SET OF BLOOD CULTURES WHEN MULTIPLE SETS ARE DRAWN IS UNCERTAIN. PLEASE NOTIFY THE MICROBIOLOGY DEPARTMENT WITHIN ONE WEEK IF SPECIATION AND SENSITIVITIES ARE REQUIRED. Performed at [facility], [address] [place], [place] [address]    Report Status 02/26/2019 FINAL  Final  Blood Culture ID Panel (Reflexed)     Status: Abnormal   Collection Time: 02/23/19 11:56 AM  Result Value Ref Range Status   Enterococcus species NOT DETECTED NOT DETECTED Final   Listeria monocytogenes  NOT DETECTED NOT DETECTED Final   Staphylococcus species DETECTED (A) NOT DETECTED Final    Comment: Methicillin (oxacillin) susceptible coagulase negative staphylococcus. Possible blood culture contaminant (unless isolated from more than one blood culture draw or clinical case suggests pathogenicity). No antibiotic treatment is indicated for blood  culture contaminants. CRITICAL RESULT CALLED TO, READ BACK BY AND VERIFIED WITH: PHARMD R. RUMMBARGER [id number] FCP    Staphylococcus aureus (BCID) NOT DETECTED NOT DETECTED Final   Methicillin resistance NOT DETECTED NOT DETECTED Final   Streptococcus species NOT DETECTED NOT DETECTED Final   Streptococcus agalactiae NOT DETECTED NOT DETECTED Final   Streptococcus pneumoniae NOT DETECTED NOT DETECTED Final   Streptococcus pyogenes NOT DETECTED NOT DETECTED Final   Acinetobacter baumannii NOT DETECTED NOT DETECTED Final   Enterobacteriaceae species NOT DETECTED NOT DETECTED Final   Enterobacter cloacae complex NOT DETECTED NOT DETECTED Final   Escherichia coli NOT DETECTED NOT DETECTED Final   Klebsiella oxytoca NOT DETECTED NOT DETECTED Final   Klebsiella pneumoniae NOT DETECTED NOT DETECTED Final   Proteus species NOT DETECTED NOT DETECTED Final   Serratia marcescens NOT DETECTED NOT DETECTED Final   Haemophilus influenzae NOT DETECTED NOT  DETECTED Final   Neisseria meningitidis NOT DETECTED NOT DETECTED Final   Pseudomonas aeruginosa NOT DETECTED NOT DETECTED Final   Candida albicans NOT DETECTED NOT DETECTED Final   Candida glabrata NOT DETECTED NOT DETECTED Final   Candida krusei NOT DETECTED NOT DETECTED Final   Candida parapsilosis NOT DETECTED NOT DETECTED Final   Candida tropicalis NOT DETECTED NOT DETECTED Final    Comment: Performed at [facility], [address] [place], [place] [address]  Culture, blood (Routine X 2) w Reflex to ID Panel     Status: None   Collection Time: 02/23/19 12:02 PM  Result Value Ref Range  Status   Specimen Description BLOOD RIGHT HAND  Final   Special Requests   Final    BOTTLES DRAWN AEROBIC ONLY Blood Culture adequate volume   Culture   Final    NO GROWTH 5 DAYS Performed at [facility], [address], [place], [place] [address]    Report Status 02/28/2019 FINAL  Final  Culture, Urine     Status: Abnormal   Collection Time: 02/23/19 12:10 PM  Result Value Ref Range Status   Specimen Description URINE, RANDOM  Final   Special Requests   Final    NONE Performed at [facility], [address] [place], [place] [address]    Culture 10,000 COLONIES/mL STAPHYLOCOCCUS CAPITIS (A)  Final   Report Status 02/25/2019 FINAL  Final   Organism ID, Bacteria STAPHYLOCOCCUS CAPITIS (A)  Final      Susceptibility   Staphylococcus capitis - MIC*    CIPROFLOXACIN <=0.5 SENSITIVE Sensitive     GENTAMICIN <=0.5 SENSITIVE Sensitive     NITROFURANTOIN <=16 SENSITIVE Sensitive     OXACILLIN <=0.25 SENSITIVE Sensitive     TETRACYCLINE <=1 SENSITIVE Sensitive     VANCOMYCIN 1 SENSITIVE Sensitive     TRIMETH/SULFA <=10 SENSITIVE Sensitive     CLINDAMYCIN <=0.25 SENSITIVE Sensitive     RIFAMPIN <=0.5 SENSITIVE Sensitive     Inducible Clindamycin NEGATIVE Sensitive     * 10,000 COLONIES/mL STAPHYLOCOCCUS CAPITIS  Culture, respiratory (non-expectorated)     Status: None   Collection Time: 02/23/19 12:17 PM  Result Value Ref Range Status   Specimen Description TRACHEAL ASPIRATE  Final   Special Requests NONE  Final   Gram Stain   Final    RARE WBC PRESENT, PREDOMINANTLY PMN MODERATE GRAM POSITIVE COCCI IN PAIRS FEW GRAM NEGATIVE RODS Performed at [facility], [address] [place], [place] [address]    Culture   Final    MODERATE SERRATIA MARCESCENS MODERATE STREPTOCOCCUS PNEUMONIAE    Report Status 02/27/2019 FINAL  Final   Organism ID, Bacteria SERRATIA MARCESCENS  Final   Organism ID, Bacteria STREPTOCOCCUS PNEUMONIAE  Final      Susceptibility    Serratia marcescens - MIC*    CEFAZOLIN >=64 RESISTANT Resistant     CEFEPIME <=1 SENSITIVE Sensitive     CEFTAZIDIME <=1 SENSITIVE Sensitive     CEFTRIAXONE <=1 SENSITIVE Sensitive     CIPROFLOXACIN <=0.25 SENSITIVE Sensitive     GENTAMICIN <=1 SENSITIVE Sensitive     TRIMETH/SULFA <=20 SENSITIVE Sensitive     * MODERATE SERRATIA MARCESCENS   Streptococcus pneumoniae - MIC*    ERYTHROMYCIN >=8 RESISTANT Resistant     LEVOFLOXACIN 0.5 SENSITIVE Sensitive     VANCOMYCIN 0.5 SENSITIVE Sensitive     PENICILLIN (non-meningitis) <=0.06 SENSITIVE Sensitive     CEFTRIAXONE (non-meningitis) <=0.12 SENSITIVE Sensitive     *  MODERATE STREPTOCOCCUS PNEUMONIAE    Anti-infectives:  Anti-infectives (From admission, onward)   Start     Dose/Rate Route Frequency Ordered Stop   02/26/19 1400  cefTRIAXone (ROCEPHIN) 2 g in sodium chloride 0.9 % 100 mL IVPB     2 g 200 mL/hr over 30 Minutes Intravenous Every 24 hours 02/26/19 0908     02/24/19 0200  vancomycin (VANCOCIN) IVPB 1000 mg/200 mL premix  Status:  Discontinued     1,000 mg 200 mL/hr over 60 Minutes Intravenous Every 12 hours 02/23/19 1224 02/26/19 0908   02/23/19 1400  vancomycin (VANCOCIN) 1,500 mg in sodium chloride 0.9 % 500 mL IVPB     1,500 mg 250 mL/hr over 120 Minutes Intravenous  Once 02/23/19 1224 02/23/19 1803   02/23/19 1300  ceFEPIme (MAXIPIME) 2 g in sodium chloride 0.9 % 100 mL IVPB  Status:  Discontinued     2 g 200 mL/hr over 30 Minutes Intravenous Every 8 hours 02/23/19 1224 02/26/19 0908      Best Practice/Protocols:  VTE Prophylaxis: Mechanical Continous Sedation  Consults: Treatment Team:  Jadene Pierini, MD    Studies:    Events:  Subjective:    Overnight Issues:   Objective:  Vital signs for last 24 hours: Temp:  [97.9 F (36.6 C)-98.9 F (37.2 C)] 98.3 F (36.8 C) (06/04 0400) Pulse Rate:  [60-109] 70 (06/04 0727) Resp:  [15-28] 19 (06/04 0727) BP: (110-181)/(46-103) 148/53 (06/04  0727) SpO2:  [94 %-100 %] 100 % (06/04 0727) FiO2 (%):  [40 %] 40 % (06/04 0727) Weight:  [75.9 kg] 75.9 kg (06/04 0500)  Hemodynamic parameters for last 24 hours:    Intake/Output from previous day: 06/03 0701 - 06/04 0700 In: 3509.7 [I.V.:2577.2; NG/GT:832.5; IV Piggyback:100] Out: 2910 [Urine:2910]  Intake/Output this shift: No intake/output data recorded.  Vent settings for last 24 hours: Vent Mode: PSV;CPAP FiO2 (%):  [40 %] 40 % Set Rate:  [14 bmp] 14 bmp Vt Set:  [560 mL] 560 mL PEEP:  [5 cmH20] 5 cmH20 Pressure Support:  [5 cmH20-10 cmH20] 8 cmH20 Plateau Pressure:  [15 cmH20-18 cmH20] 16 cmH20  Physical Exam:  General: on vent Neuro: pupils 2mm, localizes to pain LUE HEENT/Neck: ETT Resp: clear to auscultation bilaterally CVS: RRR GI: soft, nontender, BS WNL, no r/g Extremities: no edema, no erythema, pulses WNL  Results for orders placed or performed during the hospital encounter of March 06, 2019 (from the past 24 hour(s))  Glucose, capillary     Status: Abnormal   Collection Time: 02/27/19 11:44 AM  Result Value Ref Range   Glucose-Capillary 118 (H) 70 - 99 mg/dL  Glucose, capillary     Status: Abnormal   Collection Time: 02/27/19  3:23 PM  Result Value Ref Range   Glucose-Capillary 119 (H) 70 - 99 mg/dL  Glucose, capillary     Status: Abnormal   Collection Time: 02/27/19  7:38 PM  Result Value Ref Range   Glucose-Capillary 113 (H) 70 - 99 mg/dL  Glucose, capillary     Status: None   Collection Time: 02/27/19 11:54 PM  Result Value Ref Range   Glucose-Capillary 94 70 - 99 mg/dL  Triglycerides     Status: None   Collection Time: 02/28/19 12:10 AM  Result Value Ref Range   Triglycerides 73 <150 mg/dL  CBC     Status: Abnormal   Collection Time: 02/28/19 12:10 AM  Result Value Ref Range   WBC 17.3 (H) 4.0 - 10.5 K/uL   RBC  3.55 (L) 4.22 - 5.81 MIL/uL   Hemoglobin 10.0 (L) 13.0 - 17.0 g/dL   HCT 16.1 (L) 09.6 - 04.5 %   MCV 88.7 80.0 - 100.0 fL   MCH  28.2 26.0 - 34.0 pg   MCHC 31.7 30.0 - 36.0 g/dL   RDW 40.9 81.1 - 91.4 %   Platelets 330 150 - 400 K/uL   nRBC 0.0 0.0 - 0.2 %  Magnesium     Status: None   Collection Time: 02/28/19 12:10 AM  Result Value Ref Range   Magnesium 2.0 1.7 - 2.4 mg/dL  Basic metabolic panel     Status: Abnormal   Collection Time: 02/28/19 12:10 AM  Result Value Ref Range   Sodium 141 135 - 145 mmol/L   Potassium 4.3 3.5 - 5.1 mmol/L   Chloride 107 98 - 111 mmol/L   CO2 26 22 - 32 mmol/L   Glucose, Bld 118 (H) 70 - 99 mg/dL   BUN 22 8 - 23 mg/dL   Creatinine, Ser 7.82 (L) 0.61 - 1.24 mg/dL   Calcium 8.1 (L) 8.9 - 10.3 mg/dL   GFR calc non Af Amer >60 >60 mL/min   GFR calc Af Amer >60 >60 mL/min   Anion gap 8 5 - 15  Glucose, capillary     Status: Abnormal   Collection Time: 02/28/19  3:24 AM  Result Value Ref Range   Glucose-Capillary 100 (H) 70 - 99 mg/dL  Glucose, capillary     Status: Abnormal   Collection Time: 02/28/19  7:26 AM  Result Value Ref Range   Glucose-Capillary 131 (H) 70 - 99 mg/dL    Assessment & Plan: Present on Admission: . Subdural hematoma (HCC)    LOS: 9 days   Additional comments:I reviewed the patient's new clinical lab test results. . Found down TBI/B SDH/SAH/B temporal ICC/parietal skull FX/temporal infarct/pneumocephalus - less movement RUE, Dr. Maurice Small following Acute hypoxic ventilator dependent respiratory failure - weaning great on 5/5 but MS does not allow extubation now.  ABL anemia  ID -  Rocephin d3 for resp CX serratia and strep  [place] collar for now Hyperglycemia - SSI FEN - TF, labs in AM VTE - PAS Dispo - ICU, plan discussion with family about possible trach/PEG  Critical Care Total Time*: 49 Minutes  Violeta Gelinas, MD, MPH, FACS Trauma & General Surgery: [phone]  02/28/2019  *Care during the described time interval was provided by me. I have reviewed this patient's available data, including medical history, events of note,  physical examination and test results as part of my evaluation.

## 2019-02-28 NOTE — Progress Notes (Signed)
Patient ID: Francis Mendoza, male   DOB: 16-Jan-1946, 73 y.o.   MRN: [id number] I spoke with his family member, Crystal. She does not want him to have a trach/PEG. She will be here today at 4pm. We will further discuss one way extubation at that time.  Violeta Gelinas, MD, MPH, FACS Trauma & General Surgery: [phone]
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
## 2019-02-28 NOTE — Procedures (Signed)
Extubation Procedure Note  Patient Details:   Name: Francis Mendoza DOB: 1945/12/04 MRN: [id number]   Airway Documentation:    Vent end date: 02/28/19 Vent end time: 1635   Evaluation  O2 sats: stable throughout Complications: No apparent complications Patient did tolerate procedure well. Bilateral Breath Sounds: Diminished   No   Positive cuff leak. Patient placed on 4L  with humidity. No stridor noted. Patient is not speaking but he is trying to communicate with his family member.   Mindi Curling 02/28/2019, 4:43 PM

## 2019-02-28 NOTE — Progress Notes (Signed)
Patient ID: Francis Mendoza, male   DOB: 09-Nov-1945, 73 y.o.   MRN: [id number] I met with his family member, Crystal, at the bedside and discussed goals of care. She feels he would definitely not want to have trach/PEG. She would like to proceed with one way extubation and DNR. We will see how it goes from there. He is weaning well and may tolerate this, at least initially.  Georganna Skeans, MD, MPH, FACS Trauma & General Surgery: [phone]

## 2019-03-01 LAB — CBC
HCT: 30.5 % — ABNORMAL LOW (ref 39.0–52.0)
Hemoglobin: 9.8 g/dL — ABNORMAL LOW (ref 13.0–17.0)
MCH: 28.1 pg (ref 26.0–34.0)
MCHC: 32.1 g/dL (ref 30.0–36.0)
MCV: 87.4 fL (ref 80.0–100.0)
Platelets: 412 10*3/uL — ABNORMAL HIGH (ref 150–400)
RBC: 3.49 MIL/uL — ABNORMAL LOW (ref 4.22–5.81)
RDW: 13.3 % (ref 11.5–15.5)
WBC: 16.2 10*3/uL — ABNORMAL HIGH (ref 4.0–10.5)
nRBC: 0 % (ref 0.0–0.2)

## 2019-03-01 LAB — GLUCOSE, CAPILLARY
Glucose-Capillary: 96 mg/dL (ref 70–99)
Glucose-Capillary: 89 mg/dL (ref 70–99)
Glucose-Capillary: 98 mg/dL (ref 70–99)
Glucose-Capillary: 147 mg/dL — ABNORMAL HIGH (ref 70–99)
Glucose-Capillary: 127 mg/dL — ABNORMAL HIGH (ref 70–99)
Glucose-Capillary: 128 mg/dL — ABNORMAL HIGH (ref 70–99)

## 2019-03-01 LAB — BASIC METABOLIC PANEL
Anion gap: 11 (ref 5–15)
BUN: 16 mg/dL (ref 8–23)
CO2: 23 mmol/L (ref 22–32)
Calcium: 8.2 mg/dL — ABNORMAL LOW (ref 8.9–10.3)
Chloride: 104 mmol/L (ref 98–111)
Creatinine, Ser: 0.39 mg/dL — ABNORMAL LOW (ref 0.61–1.24)
GFR calc Af Amer: >60 mL/min (ref >60)
GFR calc non Af Amer: >60 mL/min (ref >60)
Glucose, Bld: 102 mg/dL — ABNORMAL HIGH (ref 70–99)
Potassium: 3.9 mmol/L (ref 3.5–5.1)
Sodium: 138 mmol/L (ref 135–145)

## 2019-03-01 MED ORDER — SODIUM CHLORIDE 0.9 % IV SOLN
INTRAVENOUS | Status: DC | PRN
  Administered 2019-03-01: 250 mL via INTRAVENOUS
  Administered 2019-03-04: 500 mL via INTRAVENOUS

## 2019-03-01 MED ORDER — PIVOT 1.5 CAL PO LIQD
1000.0000 mL | ORAL | Status: DC
  Administered 2019-03-01 – 2019-03-03 (×3): 1000 mL

## 2019-03-01 MED ORDER — FENTANYL CITRATE (PF) 100 MCG/2ML IJ SOLN
25.0000 ug | INTRAMUSCULAR | Status: DC | PRN
  Administered 2019-03-01 – 2019-03-03 (×2): 25 ug via INTRAVENOUS
  Filled 2019-03-01 (×2): qty 2

## 2019-03-01 MED ORDER — OXYCODONE HCL 5 MG/5ML PO SOLN: 10.0000 mg | ORAL | Status: DC | PRN

## 2019-03-01 NOTE — Procedures (Signed)
Cortrak  Person Inserting Tube:  Kahleb Mcclane, RD Tube Type:  Cortrak - 43 inches Tube Location:  Left nare Initial Placement:  Stomach Secured by: Bridle Technique Used to Measure Tube Placement:  Documented cm marking at nare/ corner of mouth Cortrak Secured At:  70 cm   No x-ray is required. RN may begin using tube.   If the tube becomes dislodged please keep the tube and contact the Cortrak team at [email or web address] ([email or web address]) for replacement.  If after hours and replacement cannot be delayed, place a NG tube and confirm placement with an abdominal x-ray.    Amish Mintzer RD, LDN Clinical Nutrition Pager # - [phone]    
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
## 2019-03-01 NOTE — Progress Notes (Signed)
Patient ID: Francis Mendoza, male   DOB: 05-Mar-1946, 73 y.o.   MRN: [id number] Follow up - Trauma Critical Care  Patient Details:    Francis HousemanRalph Mendoza is an 73 y.o. male.  Lines/tubes : Rectal Tube/Pouch (Active)  Output (mL) 100 mL 03/01/2019  8:00 AM     External Urinary Catheter (Active)  Collection Container Standard drainage bag 03/01/2019  8:00 AM  Securement Method Leg strap 03/01/2019  8:00 AM  Output (mL) 85 mL 03/01/2019  8:00 AM    Microbiology/Sepsis markers: Results for orders placed or performed during the hospital encounter of Sep 18, 2019  SARS Coronavirus 2 (CEPHEID- Performed in [facility] hospital lab), Hosp Order     Status: None   Collection Time: Sep 18, 2019 11:57 PM  Result Value Ref Range Status   SARS Coronavirus 2 NEGATIVE NEGATIVE Final    Comment: (NOTE) If result is NEGATIVE SARS-CoV-2 target nucleic acids are NOT DETECTED. The SARS-CoV-2 RNA is generally detectable in upper and lower  respiratory specimens during the acute phase of infection. The lowest  concentration of SARS-CoV-2 viral copies this assay can detect is 250  copies / mL. A negative result does not preclude SARS-CoV-2 infection  and should not be used as the sole basis for treatment or other  patient management decisions.  A negative result may occur with  improper specimen collection / handling, submission of specimen other  than nasopharyngeal swab, presence of viral mutation(s) within the  areas targeted by this assay, and inadequate number of viral copies  (<250 copies / mL). A negative result must be combined with clinical  observations, patient history, and epidemiological information. If result is POSITIVE SARS-CoV-2 target nucleic acids are DETECTED. The SARS-CoV-2 RNA is generally detectable in upper and lower  respiratory specimens dur ing the acute phase of infection.  Positive  results are indicative of active infection with SARS-CoV-2.  Clinical  correlation with patient history and  other diagnostic information is  necessary to determine patient infection status.  Positive results do  not rule out bacterial infection or co-infection with other viruses. If result is PRESUMPTIVE POSTIVE SARS-CoV-2 nucleic acids MAY BE PRESENT.   A presumptive positive result was obtained on the submitted specimen  and confirmed on repeat testing.  While 2019 novel coronavirus  (SARS-CoV-2) nucleic acids may be present in the submitted sample  additional confirmatory testing may be necessary for epidemiological  and / or clinical management purposes  to differentiate between  SARS-CoV-2 and other Sarbecovirus currently known to infect humans.  If clinically indicated additional testing with an alternate test  methodology [phone]([id number]) is advised. The SARS-CoV-2 RNA is generally  detectable in upper and lower respiratory sp ecimens during the acute  phase of infection. The expected result is Negative. Fact Sheet for Patients:  BoilerBrush.com.cyhttps://www.fda.gov/media/136312/download Fact Sheet for Healthcare Providers: https://pope.com/https://www.fda.gov/media/136313/download This test is not yet approved or cleared by the [place] FDA and has been authorized for detection and/or diagnosis of SARS-CoV-2 by FDA under an Emergency Use Authorization (EUA).  This EUA will remain in effect (meaning this test can be used) for the duration of the COVID-19 declaration under Section 564(b)(1) of the Act, 21 U.S.C. section 360bbb-3(b)(1), unless the authorization is terminated or revoked sooner. Performed at [facility], [address] [place], [place] [address]   MRSA PCR Screening     Status: None   Collection Time: 02/19/19  1:19 AM  Result Value Ref Range Status   MRSA by PCR NEGATIVE NEGATIVE Final  Comment:        The GeneXpert MRSA Assay (FDA approved for NASAL specimens only), is one component of a comprehensive MRSA colonization surveillance program. It is not intended to diagnose MRSA infection  nor to guide or monitor treatment for MRSA infections. Performed at [facility], [address], [place], [place] [address]   Culture, blood (Routine X 2) w Reflex to ID Panel     Status: Abnormal   Collection Time: 02/23/19 11:56 AM  Result Value Ref Range Status   Specimen Description BLOOD LEFT HAND  Final   Special Requests   Final    BOTTLES DRAWN AEROBIC ONLY Blood Culture adequate volume   Culture  Setup Time   Final    GRAM POSITIVE COCCI IN CLUSTERS AEROBIC BOTTLE ONLY CRITICAL RESULT CALLED TO, READ BACK BY AND VERIFIED WITH: PHARMD R. RUMMBARGER [id number] FCP    Culture (A)  Final    STAPHYLOCOCCUS SPECIES (COAGULASE NEGATIVE) THE SIGNIFICANCE OF ISOLATING THIS ORGANISM FROM A SINGLE SET OF BLOOD CULTURES WHEN MULTIPLE SETS ARE DRAWN IS UNCERTAIN. PLEASE NOTIFY THE MICROBIOLOGY DEPARTMENT WITHIN ONE WEEK IF SPECIATION AND SENSITIVITIES ARE REQUIRED. Performed at [facility], [address] [place], [place] [address]    Report Status 02/26/2019 FINAL  Final  Blood Culture ID Panel (Reflexed)     Status: Abnormal   Collection Time: 02/23/19 11:56 AM  Result Value Ref Range Status   Enterococcus species NOT DETECTED NOT DETECTED Final   Listeria monocytogenes NOT DETECTED NOT DETECTED Final   Staphylococcus species DETECTED (A) NOT DETECTED Final    Comment: Methicillin (oxacillin) susceptible coagulase negative staphylococcus. Possible blood culture contaminant (unless isolated from more than one blood culture draw or clinical case suggests pathogenicity). No antibiotic treatment is indicated for blood  culture contaminants. CRITICAL RESULT CALLED TO, READ BACK BY AND VERIFIED WITH: PHARMD R. RUMMBARGER [id number] FCP    Staphylococcus aureus (BCID) NOT DETECTED NOT DETECTED Final   Methicillin resistance NOT DETECTED NOT DETECTED Final   Streptococcus species NOT DETECTED NOT DETECTED Final   Streptococcus agalactiae NOT DETECTED NOT DETECTED Final    Streptococcus pneumoniae NOT DETECTED NOT DETECTED Final   Streptococcus pyogenes NOT DETECTED NOT DETECTED Final   Acinetobacter baumannii NOT DETECTED NOT DETECTED Final   Enterobacteriaceae species NOT DETECTED NOT DETECTED Final   Enterobacter cloacae complex NOT DETECTED NOT DETECTED Final   Escherichia coli NOT DETECTED NOT DETECTED Final   Klebsiella oxytoca NOT DETECTED NOT DETECTED Final   Klebsiella pneumoniae NOT DETECTED NOT DETECTED Final   Proteus species NOT DETECTED NOT DETECTED Final   Serratia marcescens NOT DETECTED NOT DETECTED Final   Haemophilus influenzae NOT DETECTED NOT DETECTED Final   Neisseria meningitidis NOT DETECTED NOT DETECTED Final   Pseudomonas aeruginosa NOT DETECTED NOT DETECTED Final   Candida albicans NOT DETECTED NOT DETECTED Final   Candida glabrata NOT DETECTED NOT DETECTED Final   Candida krusei NOT DETECTED NOT DETECTED Final   Candida parapsilosis NOT DETECTED NOT DETECTED Final   Candida tropicalis NOT DETECTED NOT DETECTED Final    Comment: Performed at [facility], [address], [place], [place] [address]  Culture, blood (Routine X 2) w Reflex to ID Panel     Status: None   Collection Time: 02/23/19 12:02 PM  Result Value Ref Range Status   Specimen Description BLOOD RIGHT HAND  Final   Special Requests   Final    BOTTLES DRAWN AEROBIC  ONLY Blood Culture adequate volume   Culture   Final    NO GROWTH 5 DAYS Performed at [facility], [address] [place], [place] [address]    Report Status 02/28/2019 FINAL  Final  Culture, Urine     Status: Abnormal   Collection Time: 02/23/19 12:10 PM  Result Value Ref Range Status   Specimen Description URINE, RANDOM  Final   Special Requests   Final    NONE Performed at [facility], [address], [place], [place] [address]    Culture 10,000 COLONIES/mL STAPHYLOCOCCUS CAPITIS (A)  Final   Report Status 02/25/2019 FINAL  Final   Organism ID, Bacteria STAPHYLOCOCCUS  CAPITIS (A)  Final      Susceptibility   Staphylococcus capitis - MIC*    CIPROFLOXACIN <=0.5 SENSITIVE Sensitive     GENTAMICIN <=0.5 SENSITIVE Sensitive     NITROFURANTOIN <=16 SENSITIVE Sensitive     OXACILLIN <=0.25 SENSITIVE Sensitive     TETRACYCLINE <=1 SENSITIVE Sensitive     VANCOMYCIN 1 SENSITIVE Sensitive     TRIMETH/SULFA <=10 SENSITIVE Sensitive     CLINDAMYCIN <=0.25 SENSITIVE Sensitive     RIFAMPIN <=0.5 SENSITIVE Sensitive     Inducible Clindamycin NEGATIVE Sensitive     * 10,000 COLONIES/mL STAPHYLOCOCCUS CAPITIS  Culture, respiratory (non-expectorated)     Status: None   Collection Time: 02/23/19 12:17 PM  Result Value Ref Range Status   Specimen Description TRACHEAL ASPIRATE  Final   Special Requests NONE  Final   Gram Stain   Final    RARE WBC PRESENT, PREDOMINANTLY PMN MODERATE GRAM POSITIVE COCCI IN PAIRS FEW GRAM NEGATIVE RODS Performed at [facility], [address], [place], [place] [address]    Culture   Final    MODERATE SERRATIA MARCESCENS MODERATE STREPTOCOCCUS PNEUMONIAE    Report Status 02/27/2019 FINAL  Final   Organism ID, Bacteria SERRATIA MARCESCENS  Final   Organism ID, Bacteria STREPTOCOCCUS PNEUMONIAE  Final      Susceptibility   Serratia marcescens - MIC*    CEFAZOLIN >=64 RESISTANT Resistant     CEFEPIME <=1 SENSITIVE Sensitive     CEFTAZIDIME <=1 SENSITIVE Sensitive     CEFTRIAXONE <=1 SENSITIVE Sensitive     CIPROFLOXACIN <=0.25 SENSITIVE Sensitive     GENTAMICIN <=1 SENSITIVE Sensitive     TRIMETH/SULFA <=20 SENSITIVE Sensitive     * MODERATE SERRATIA MARCESCENS   Streptococcus pneumoniae - MIC*    ERYTHROMYCIN >=8 RESISTANT Resistant     LEVOFLOXACIN 0.5 SENSITIVE Sensitive     VANCOMYCIN 0.5 SENSITIVE Sensitive     PENICILLIN (non-meningitis) <=0.06 SENSITIVE Sensitive     CEFTRIAXONE (non-meningitis) <=0.12 SENSITIVE Sensitive     * MODERATE STREPTOCOCCUS PNEUMONIAE    Anti-infectives:  Anti-infectives (From  admission, onward)   Start     Dose/Rate Route Frequency Ordered Stop   02/26/19 1400  cefTRIAXone (ROCEPHIN) 2 g in sodium chloride 0.9 % 100 mL IVPB     2 g 200 mL/hr over 30 Minutes Intravenous Every 24 hours 02/26/19 0908 03/01/19 2359   02/24/19 0200  vancomycin (VANCOCIN) IVPB 1000 mg/200 mL premix  Status:  Discontinued     1,000 mg 200 mL/hr over 60 Minutes Intravenous Every 12 hours 02/23/19 1224 02/26/19 0908   02/23/19 1400  vancomycin (VANCOCIN) 1,500 mg in sodium chloride 0.9 % 500 mL IVPB     1,500 mg 250 mL/hr over 120 Minutes Intravenous  Once 02/23/19 1224 02/23/19  1803   02/23/19 1300  ceFEPIme (MAXIPIME) 2 g in sodium chloride 0.9 % 100 mL IVPB  Status:  Discontinued     2 g 200 mL/hr over 30 Minutes Intravenous Every 8 hours 02/23/19 1224 02/26/19 0908      Best Practice/Protocols:  VTE Prophylaxis: Mechanical off sedation  Consults: Treatment Team:  Jadene Pierini, MD    Studies:    Events:  Subjective:    Overnight Issues:   Objective:  Vital signs for last 24 hours: Temp:  [98 F (36.7 C)-98.5 F (36.9 C)] 98.5 F (36.9 C) (06/05 0809) Pulse Rate:  [65-106] 73 (06/05 0809) Resp:  [15-27] 15 (06/05 0809) BP: (127-176)/(49-95) 153/82 (06/05 0809) SpO2:  [94 %-100 %] 99 % (06/05 0809) FiO2 (%):  [40 %] 40 % (06/04 1600)  Hemodynamic parameters for last 24 hours:    Intake/Output from previous day: 06/04 0701 - 06/05 0700 In: 570.7 [I.V.:470.7; IV Piggyback:100] Out: 4300 [Urine:4300]  Intake/Output this shift: Total I/O In: -  Out: 185 [Urine:85; Stool:100]  Vent settings for last 24 hours: Vent Mode: PSV;CPAP FiO2 (%):  [40 %] 40 % PEEP:  [5 cmH20] 5 cmH20 Pressure Support:  [8 cmH20] 8 cmH20  Physical Exam:  General: no distress Neuro: localizes to pain, not F/C HEENT/Neck: no JVD Resp: few rhonchi CVS: RRR GI: soft, nontender, BS WNL, no r/g Extremities: no edema, no erythema, pulses WNL Neck addendum: no posterior  midline tenderness, collar removed  Results for orders placed or performed during the hospital encounter of March 06, 2019 (from the past 24 hour(s))  Glucose, capillary     Status: Abnormal   Collection Time: 02/28/19 11:04 AM  Result Value Ref Range   Glucose-Capillary 137 (H) 70 - 99 mg/dL  Glucose, capillary     Status: Abnormal   Collection Time: 02/28/19  3:18 PM  Result Value Ref Range   Glucose-Capillary 108 (H) 70 - 99 mg/dL  Glucose, capillary     Status: Abnormal   Collection Time: 02/28/19  7:19 PM  Result Value Ref Range   Glucose-Capillary 104 (H) 70 - 99 mg/dL  Glucose, capillary     Status: Abnormal   Collection Time: 02/28/19 11:15 PM  Result Value Ref Range   Glucose-Capillary 110 (H) 70 - 99 mg/dL  CBC     Status: Abnormal   Collection Time: 03/01/19  2:17 AM  Result Value Ref Range   WBC 16.2 (H) 4.0 - 10.5 K/uL   RBC 3.49 (L) 4.22 - 5.81 MIL/uL   Hemoglobin 9.8 (L) 13.0 - 17.0 g/dL   HCT 16.1 (L) 09.6 - 04.5 %   MCV 87.4 80.0 - 100.0 fL   MCH 28.1 26.0 - 34.0 pg   MCHC 32.1 30.0 - 36.0 g/dL   RDW 40.9 81.1 - 91.4 %   Platelets 412 (H) 150 - 400 K/uL   nRBC 0.0 0.0 - 0.2 %  Basic metabolic panel     Status: Abnormal   Collection Time: 03/01/19  2:17 AM  Result Value Ref Range   Sodium 138 135 - 145 mmol/L   Potassium 3.9 3.5 - 5.1 mmol/L   Chloride 104 98 - 111 mmol/L   CO2 23 22 - 32 mmol/L   Glucose, Bld 102 (H) 70 - 99 mg/dL   BUN 16 8 - 23 mg/dL   Creatinine, Ser 7.82 (L) 0.61 - 1.24 mg/dL   Calcium 8.2 (L) 8.9 - 10.3 mg/dL   GFR calc non Af Amer >60 >60  mL/min   GFR calc Af Amer >60 >60 mL/min   Anion gap 11 5 - 15  Glucose, capillary     Status: None   Collection Time: 03/01/19  3:08 AM  Result Value Ref Range   Glucose-Capillary 96 70 - 99 mg/dL  Glucose, capillary     Status: None   Collection Time: 03/01/19  7:45 AM  Result Value Ref Range   Glucose-Capillary 89 70 - 99 mg/dL    Assessment & Plan: Present on Admission: . Subdural  hematoma (HCC)    LOS: 10 days   Additional comments:I reviewed the patient's new clinical lab test results. . 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 Maurice Small S.O. Acute hypoxic respiratory failure - one way extubation 6/4, [place] O2  ABL anemia  ID -  Rocephin d4 (but completes 7d abx today) for resp CX serratia and strep  C spine cleared Hyperglycemia - SSI FEN - place Cortrak, consider swallow eval if wakes up VTE - PAS Dispo - ICU, one way extubated. DNR/DNI Critical Care Total Time*: 36 Minutes  Violeta Gelinas, MD, MPH, FACS Trauma & General Surgery: [phone]  03/01/2019  *Care during the described time interval was provided by me. I have reviewed this patient's available data, including medical history, events of note, physical examination and test results as part of my evaluation.

## 2019-03-02 LAB — GLUCOSE, CAPILLARY
Glucose-Capillary: 139 mg/dL — ABNORMAL HIGH (ref 70–99)
Glucose-Capillary: 137 mg/dL — ABNORMAL HIGH (ref 70–99)
Glucose-Capillary: 140 mg/dL — ABNORMAL HIGH (ref 70–99)
Glucose-Capillary: 122 mg/dL — ABNORMAL HIGH (ref 70–99)
Glucose-Capillary: 122 mg/dL — ABNORMAL HIGH (ref 70–99)
Glucose-Capillary: 115 mg/dL — ABNORMAL HIGH (ref 70–99)

## 2019-03-02 MED ORDER — CHLORHEXIDINE GLUCONATE 0.12 % MT SOLN
15.0000 mL | Freq: Two times a day (BID) | OROMUCOSAL | Status: DC
  Administered 2019-03-02 – 2019-03-05 (×8): 15 mL via OROMUCOSAL
  Filled 2019-03-02 (×6): qty 15

## 2019-03-02 MED ORDER — ORAL CARE MOUTH RINSE
15.0000 mL | Freq: Two times a day (BID) | OROMUCOSAL | Status: DC
  Administered 2019-03-02 – 2019-03-05 (×7): 15 mL via OROMUCOSAL

## 2019-03-02 NOTE — Progress Notes (Signed)
Patient ID: Francis Mendoza, male   DOB: 1946/01/02, 73 y.o.   MRN: [id number] Follow up - Trauma Critical Care  Patient Details:    Francis HousemanRalph Mendoza is an 73 y.o. male.  Lines/tubes : Rectal Tube/Pouch (Active)  Output (mL) 100 mL 03/01/2019  8:00 AM     External Urinary Catheter (Active)  Collection Container Standard drainage bag 03/01/2019  8:00 AM  Securement Method Leg strap 03/01/2019  8:00 AM  Output (mL) 85 mL 03/01/2019  8:00 AM    Microbiology/Sepsis markers: Results for orders placed or performed during the hospital encounter of 02/01/2019  SARS Coronavirus 2 (CEPHEID- Performed in [facility] hospital lab), Hosp Order     Status: None   Collection Time: 02/20/2019 11:57 PM  Result Value Ref Range Status   SARS Coronavirus 2 NEGATIVE NEGATIVE Final    Comment: (NOTE) If result is NEGATIVE SARS-CoV-2 target nucleic acids are NOT DETECTED. The SARS-CoV-2 RNA is generally detectable in upper and lower  respiratory specimens during the acute phase of infection. The lowest  concentration of SARS-CoV-2 viral copies this assay can detect is 250  copies / mL. A negative result does not preclude SARS-CoV-2 infection  and should not be used as the sole basis for treatment or other  patient management decisions.  A negative result may occur with  improper specimen collection / handling, submission of specimen other  than nasopharyngeal swab, presence of viral mutation(s) within the  areas targeted by this assay, and inadequate number of viral copies  (<250 copies / mL). A negative result must be combined with clinical  observations, patient history, and epidemiological information. If result is POSITIVE SARS-CoV-2 target nucleic acids are DETECTED. The SARS-CoV-2 RNA is generally detectable in upper and lower  respiratory specimens dur ing the acute phase of infection.  Positive  results are indicative of active infection with SARS-CoV-2.  Clinical  correlation with patient history and  other diagnostic information is  necessary to determine patient infection status.  Positive results do  not rule out bacterial infection or co-infection with other viruses. If result is PRESUMPTIVE POSTIVE SARS-CoV-2 nucleic acids MAY BE PRESENT.   A presumptive positive result was obtained on the submitted specimen  and confirmed on repeat testing.  While 2019 novel coronavirus  (SARS-CoV-2) nucleic acids may be present in the submitted sample  additional confirmatory testing may be necessary for epidemiological  and / or clinical management purposes  to differentiate between  SARS-CoV-2 and other Sarbecovirus currently known to infect humans.  If clinically indicated additional testing with an alternate test  methodology [phone]([id number]) is advised. The SARS-CoV-2 RNA is generally  detectable in upper and lower respiratory sp ecimens during the acute  phase of infection. The expected result is Negative. Fact Sheet for Patients:  BoilerBrush.com.cyhttps://www.fda.gov/media/136312/download Fact Sheet for Healthcare Providers: https://pope.com/https://www.fda.gov/media/136313/download This test is not yet approved or cleared by the [place] FDA and has been authorized for detection and/or diagnosis of SARS-CoV-2 by FDA under an Emergency Use Authorization (EUA).  This EUA will remain in effect (meaning this test can be used) for the duration of the COVID-19 declaration under Section 564(b)(1) of the Act, 21 U.S.C. section 360bbb-3(b)(1), unless the authorization is terminated or revoked sooner. Performed at [facility], [address] [place], [place] [address]   MRSA PCR Screening     Status: None   Collection Time: 02/19/19  1:19 AM  Result Value Ref Range Status   MRSA by PCR NEGATIVE NEGATIVE Final  Comment:        The GeneXpert MRSA Assay (FDA approved for NASAL specimens only), is one component of a comprehensive MRSA colonization surveillance program. It is not intended to diagnose MRSA infection  nor to guide or monitor treatment for MRSA infections. Performed at [facility], [address], [place], [place] [address]   Culture, blood (Routine X 2) w Reflex to ID Panel     Status: Abnormal   Collection Time: 02/23/19 11:56 AM  Result Value Ref Range Status   Specimen Description BLOOD LEFT HAND  Final   Special Requests   Final    BOTTLES DRAWN AEROBIC ONLY Blood Culture adequate volume   Culture  Setup Time   Final    GRAM POSITIVE COCCI IN CLUSTERS AEROBIC BOTTLE ONLY CRITICAL RESULT CALLED TO, READ BACK BY AND VERIFIED WITH: PHARMD R. RUMMBARGER [phone] FCP    Culture (A)  Final    STAPHYLOCOCCUS SPECIES (COAGULASE NEGATIVE) THE SIGNIFICANCE OF ISOLATING THIS ORGANISM FROM A SINGLE SET OF BLOOD CULTURES WHEN MULTIPLE SETS ARE DRAWN IS UNCERTAIN. PLEASE NOTIFY THE MICROBIOLOGY DEPARTMENT WITHIN ONE WEEK IF SPECIATION AND SENSITIVITIES ARE REQUIRED. Performed at [facility], [address] [place], [place] [address]    Report Status 02/26/2019 FINAL  Final  Blood Culture ID Panel (Reflexed)     Status: Abnormal   Collection Time: 02/23/19 11:56 AM  Result Value Ref Range Status   Enterococcus species NOT DETECTED NOT DETECTED Final   Listeria monocytogenes NOT DETECTED NOT DETECTED Final   Staphylococcus species DETECTED (A) NOT DETECTED Final    Comment: Methicillin (oxacillin) susceptible coagulase negative staphylococcus. Possible blood culture contaminant (unless isolated from more than one blood culture draw or clinical case suggests pathogenicity). No antibiotic treatment is indicated for blood  culture contaminants. CRITICAL RESULT CALLED TO, READ BACK BY AND VERIFIED WITH: PHARMD R. RUMMBARGER [id number] FCP    Staphylococcus aureus (BCID) NOT DETECTED NOT DETECTED Final   Methicillin resistance NOT DETECTED NOT DETECTED Final   Streptococcus species NOT DETECTED NOT DETECTED Final   Streptococcus agalactiae NOT DETECTED NOT DETECTED Final    Streptococcus pneumoniae NOT DETECTED NOT DETECTED Final   Streptococcus pyogenes NOT DETECTED NOT DETECTED Final   Acinetobacter baumannii NOT DETECTED NOT DETECTED Final   Enterobacteriaceae species NOT DETECTED NOT DETECTED Final   Enterobacter cloacae complex NOT DETECTED NOT DETECTED Final   Escherichia coli NOT DETECTED NOT DETECTED Final   Klebsiella oxytoca NOT DETECTED NOT DETECTED Final   Klebsiella pneumoniae NOT DETECTED NOT DETECTED Final   Proteus species NOT DETECTED NOT DETECTED Final   Serratia marcescens NOT DETECTED NOT DETECTED Final   Haemophilus influenzae NOT DETECTED NOT DETECTED Final   Neisseria meningitidis NOT DETECTED NOT DETECTED Final   Pseudomonas aeruginosa NOT DETECTED NOT DETECTED Final   Candida albicans NOT DETECTED NOT DETECTED Final   Candida glabrata NOT DETECTED NOT DETECTED Final   Candida krusei NOT DETECTED NOT DETECTED Final   Candida parapsilosis NOT DETECTED NOT DETECTED Final   Candida tropicalis NOT DETECTED NOT DETECTED Final    Comment: Performed at [facility], [address], [place], [place] [address]  Culture, blood (Routine X 2) w Reflex to ID Panel     Status: None   Collection Time: 02/23/19 12:02 PM  Result Value Ref Range Status   Specimen Description BLOOD RIGHT HAND  Final   Special Requests   Final    BOTTLES DRAWN AEROBIC  ONLY Blood Culture adequate volume   Culture   Final    NO GROWTH 5 DAYS Performed at [facility], [place] [address] [place], [place] [address]    Report Status 02/28/2019 FINAL  Final  Culture, Urine     Status: Abnormal   Collection Time: 02/23/19 12:10 PM  Result Value Ref Range Status   Specimen Description URINE, RANDOM  Final   Special Requests   Final    NONE Performed at [facility], [place] [address], [place], [place] [address]    Culture 10,000 COLONIES/mL STAPHYLOCOCCUS CAPITIS (A)  Final   Report Status 02/25/2019 FINAL  Final   Organism ID, Bacteria STAPHYLOCOCCUS  CAPITIS (A)  Final      Susceptibility   Staphylococcus capitis - MIC*    CIPROFLOXACIN <=0.5 SENSITIVE Sensitive     GENTAMICIN <=0.5 SENSITIVE Sensitive     NITROFURANTOIN <=16 SENSITIVE Sensitive     OXACILLIN <=0.25 SENSITIVE Sensitive     TETRACYCLINE <=1 SENSITIVE Sensitive     VANCOMYCIN 1 SENSITIVE Sensitive     TRIMETH/SULFA <=10 SENSITIVE Sensitive     CLINDAMYCIN <=0.25 SENSITIVE Sensitive     RIFAMPIN <=0.5 SENSITIVE Sensitive     Inducible Clindamycin NEGATIVE Sensitive     * 10,000 COLONIES/mL STAPHYLOCOCCUS CAPITIS  Culture, respiratory (non-expectorated)     Status: None   Collection Time: 02/23/19 12:17 PM  Result Value Ref Range Status   Specimen Description TRACHEAL ASPIRATE  Final   Special Requests NONE  Final   Gram Stain   Final    RARE WBC PRESENT, PREDOMINANTLY PMN MODERATE GRAM POSITIVE COCCI IN PAIRS FEW GRAM NEGATIVE RODS Performed at [facility], [place] [address], [place], [place] [address]    Culture   Final    MODERATE SERRATIA MARCESCENS MODERATE STREPTOCOCCUS PNEUMONIAE    Report Status 02/27/2019 FINAL  Final   Organism ID, Bacteria SERRATIA MARCESCENS  Final   Organism ID, Bacteria STREPTOCOCCUS PNEUMONIAE  Final      Susceptibility   Serratia marcescens - MIC*    CEFAZOLIN >=64 RESISTANT Resistant     CEFEPIME <=1 SENSITIVE Sensitive     CEFTAZIDIME <=1 SENSITIVE Sensitive     CEFTRIAXONE <=1 SENSITIVE Sensitive     CIPROFLOXACIN <=0.25 SENSITIVE Sensitive     GENTAMICIN <=1 SENSITIVE Sensitive     TRIMETH/SULFA <=20 SENSITIVE Sensitive     * MODERATE SERRATIA MARCESCENS   Streptococcus pneumoniae - MIC*    ERYTHROMYCIN >=8 RESISTANT Resistant     LEVOFLOXACIN 0.5 SENSITIVE Sensitive     VANCOMYCIN 0.5 SENSITIVE Sensitive     PENICILLIN (non-meningitis) <=0.06 SENSITIVE Sensitive     CEFTRIAXONE (non-meningitis) <=0.12 SENSITIVE Sensitive     * MODERATE STREPTOCOCCUS PNEUMONIAE    Anti-infectives:  Anti-infectives (From  admission, onward)   Start     Dose/Rate Route Frequency Ordered Stop   02/26/19 1400  cefTRIAXone (ROCEPHIN) 2 g in sodium chloride 0.9 % 100 mL IVPB     2 g 200 mL/hr over 30 Minutes Intravenous Every 24 hours 02/26/19 0908 03/01/19 1529   02/24/19 0200  vancomycin (VANCOCIN) IVPB 1000 mg/200 mL premix  Status:  Discontinued     1,000 mg 200 mL/hr over 60 Minutes Intravenous Every 12 hours 02/23/19 1224 02/26/19 0908   02/23/19 1400  vancomycin (VANCOCIN) 1,500 mg in sodium chloride 0.9 % 500 mL IVPB     1,500 mg 250 mL/hr over 120 Minutes Intravenous  Once 02/23/19 1224 02/23/19  1803   02/23/19 1300  ceFEPIme (MAXIPIME) 2 g in sodium chloride 0.9 % 100 mL IVPB  Status:  Discontinued     2 g 200 mL/hr over 30 Minutes Intravenous Every 8 hours 02/23/19 1224 02/26/19 0908      Best Practice/Protocols:  VTE Prophylaxis: Mechanical off sedation  Consults: Treatment Team:  Jadene Pierinistergard, Thomas A, MD    Studies:    Events:  Subjective:    Overnight Issues:  Started on enteral feeds yesterday o/w no events Objective:  Vital signs for last 24 hours: Temp:  [98.2 F (36.8 C)-99.3 F (37.4 C)] 99.3 F (37.4 C) (06/06 0800) Pulse Rate:  [35-114] 74 (06/06 0800) Resp:  [14-27] 23 (06/06 0800) BP: (145-177)/(62-114) 162/66 (06/06 0800) SpO2:  [94 %-98 %] 96 % (06/06 0800)  Hemodynamic parameters for last 24 hours:    Intake/Output from previous day: 06/05 0701 - 06/06 0700 In: 1705.6 [NG/GT:1605.6; IV Piggyback:100] Out: 2670 [Urine:2570; Stool:100]  Intake/Output this shift: No intake/output data recorded.  Vent settings for last 24 hours:    Physical Exam:  General: no distress Neuro: localizes to pain, not F/C HEENT/Neck: no JVD Resp: few rhonchi CVS: RRR GI: soft, nontender, BS WNL, no r/g Extremities: no edema, no erythema, pulses WNL   Results for orders placed or performed during the hospital encounter of 02/03/2019 (from the past 24 hour(s))  Glucose,  capillary     Status: None   Collection Time: 03/01/19 11:19 AM  Result Value Ref Range   Glucose-Capillary 98 70 - 99 mg/dL  Glucose, capillary     Status: Abnormal   Collection Time: 03/01/19  3:08 PM  Result Value Ref Range   Glucose-Capillary 147 (H) 70 - 99 mg/dL  Glucose, capillary     Status: Abnormal   Collection Time: 03/01/19  7:09 PM  Result Value Ref Range   Glucose-Capillary 127 (H) 70 - 99 mg/dL  Glucose, capillary     Status: Abnormal   Collection Time: 03/01/19 11:02 PM  Result Value Ref Range   Glucose-Capillary 128 (H) 70 - 99 mg/dL  Glucose, capillary     Status: Abnormal   Collection Time: 03/02/19  3:27 AM  Result Value Ref Range   Glucose-Capillary 139 (H) 70 - 99 mg/dL  Glucose, capillary     Status: Abnormal   Collection Time: 03/02/19  7:46 AM  Result Value Ref Range   Glucose-Capillary 137 (H) 70 - 99 mg/dL   Comment 1 Notify RN    Comment 2 Document in Chart     Assessment & Plan: Present on Admission: . Subdural hematoma (HCC)    LOS: 11 days   Additional comments:I reviewed the patient's new clinical lab test results. . 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 Maurice Smallstergard S.O. Acute hypoxic respiratory failure - one way extubation 6/4,  O2  ABL anemia  ID -  Rocephin d5 (but completes 7d abx today) for resp CX serratia and strep  C spine cleared Hyperglycemia - BS ok, cont SSI FEN -  TF via Cortrak, consider swallow eval if wakes up VTE - PAS Dispo - ICU, one way extubated. DNR/DNI Critical Care Total Time*: 30 Minutes  Mary SellaEric M. Andrey CampanileWilson, MD, FACS General, Bariatric, & Minimally Invasive Surgery [facility] [place]   03/02/2019  *Care during the described time interval was provided by me. I have reviewed this patient's available data, including medical history, events of note, physical examination and test results as part of  my evaluation.

## 2019-03-03 LAB — CBC
HCT: 32.4 % — ABNORMAL LOW (ref 39.0–52.0)
Hemoglobin: 10.7 g/dL — ABNORMAL LOW (ref 13.0–17.0)
MCH: 28.1 pg (ref 26.0–34.0)
MCHC: 33 g/dL (ref 30.0–36.0)
MCV: 85 fL (ref 80.0–100.0)
Platelets: 528 10*3/uL — ABNORMAL HIGH (ref 150–400)
RBC: 3.81 MIL/uL — ABNORMAL LOW (ref 4.22–5.81)
RDW: 13.2 % (ref 11.5–15.5)
WBC: 15.6 10*3/uL — ABNORMAL HIGH (ref 4.0–10.5)
nRBC: 0 % (ref 0.0–0.2)

## 2019-03-03 LAB — BASIC METABOLIC PANEL
Anion gap: 7 (ref 5–15)
BUN: 21 mg/dL (ref 8–23)
CO2: 25 mmol/L (ref 22–32)
Calcium: 8.2 mg/dL — ABNORMAL LOW (ref 8.9–10.3)
Chloride: 105 mmol/L (ref 98–111)
Creatinine, Ser: 0.38 mg/dL — ABNORMAL LOW (ref 0.61–1.24)
GFR calc Af Amer: >60 mL/min (ref >60)
GFR calc non Af Amer: >60 mL/min (ref >60)
Glucose, Bld: 141 mg/dL — ABNORMAL HIGH (ref 70–99)
Potassium: 4 mmol/L (ref 3.5–5.1)
Sodium: 137 mmol/L (ref 135–145)

## 2019-03-03 LAB — GLUCOSE, CAPILLARY
Glucose-Capillary: 137 mg/dL — ABNORMAL HIGH (ref 70–99)
Glucose-Capillary: 120 mg/dL — ABNORMAL HIGH (ref 70–99)
Glucose-Capillary: 130 mg/dL — ABNORMAL HIGH (ref 70–99)
Glucose-Capillary: 123 mg/dL — ABNORMAL HIGH (ref 70–99)
Glucose-Capillary: 117 mg/dL — ABNORMAL HIGH (ref 70–99)
Glucose-Capillary: 123 mg/dL — ABNORMAL HIGH (ref 70–99)

## 2019-03-03 LAB — TRIGLYCERIDES: Triglycerides: 52 mg/dL (ref <150)

## 2019-03-03 MED ORDER — MORPHINE SULFATE (PF) 2 MG/ML IV SOLN: 1.0000 mg | INTRAVENOUS | Status: DC | PRN
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
## 2019-03-04 DIAGNOSIS — Z515 Encounter for palliative care: Secondary | ICD-10-CM

## 2019-03-04 DIAGNOSIS — Z66 Do not resuscitate: Secondary | ICD-10-CM

## 2019-03-04 DIAGNOSIS — R0600 Dyspnea, unspecified: Secondary | ICD-10-CM

## 2019-03-04 DIAGNOSIS — J9611 Chronic respiratory failure with hypoxia: Secondary | ICD-10-CM

## 2019-03-04 DIAGNOSIS — S065X9A Traumatic subdural hemorrhage with loss of consciousness of unspecified duration, initial encounter: Principal | ICD-10-CM

## 2019-03-04 LAB — BASIC METABOLIC PANEL
Anion gap: 14 (ref 5–15)
BUN: 22 mg/dL (ref 8–23)
CO2: 21 mmol/L — ABNORMAL LOW (ref 22–32)
Calcium: 8.8 mg/dL — ABNORMAL LOW (ref 8.9–10.3)
Chloride: 102 mmol/L (ref 98–111)
Creatinine, Ser: 0.5 mg/dL — ABNORMAL LOW (ref 0.61–1.24)
GFR calc Af Amer: >60 mL/min (ref >60)
GFR calc non Af Amer: >60 mL/min (ref >60)
Glucose, Bld: 129 mg/dL — ABNORMAL HIGH (ref 70–99)
Potassium: 4.2 mmol/L (ref 3.5–5.1)
Sodium: 137 mmol/L (ref 135–145)

## 2019-03-04 LAB — CBC
HCT: 35.4 % — ABNORMAL LOW (ref 39.0–52.0)
Hemoglobin: 11.5 g/dL — ABNORMAL LOW (ref 13.0–17.0)
MCH: 28.1 pg (ref 26.0–34.0)
MCHC: 32.5 g/dL (ref 30.0–36.0)
MCV: 86.6 fL (ref 80.0–100.0)
Platelets: 605 10*3/uL — ABNORMAL HIGH (ref 150–400)
RBC: 4.09 MIL/uL — ABNORMAL LOW (ref 4.22–5.81)
RDW: 13.3 % (ref 11.5–15.5)
WBC: 15.1 10*3/uL — ABNORMAL HIGH (ref 4.0–10.5)
nRBC: 0 % (ref 0.0–0.2)

## 2019-03-04 LAB — GLUCOSE, CAPILLARY
Glucose-Capillary: 115 mg/dL — ABNORMAL HIGH (ref 70–99)
Glucose-Capillary: 133 mg/dL — ABNORMAL HIGH (ref 70–99)
Glucose-Capillary: 110 mg/dL — ABNORMAL HIGH (ref 70–99)
Glucose-Capillary: 112 mg/dL — ABNORMAL HIGH (ref 70–99)

## 2019-03-04 MED ORDER — MORPHINE 100MG IN NS 100ML (1MG/ML) PREMIX INFUSION
2.0000 mg/h | INTRAVENOUS | Status: DC
  Administered 2019-03-04: 1 mg/h via INTRAVENOUS
  Administered 2019-03-06: 2 mg/h via INTRAVENOUS
  Filled 2019-03-04 (×4): qty 100

## 2019-03-04 MED ORDER — MORPHINE SULFATE (PF) 2 MG/ML IV SOLN
1.0000 mg | INTRAVENOUS | Status: DC | PRN
  Administered 2019-03-04 (×2): 2 mg via INTRAVENOUS

## 2019-03-04 MED ORDER — GLYCOPYRROLATE 0.2 MG/ML IJ SOLN
0.4000 mg | Freq: Four times a day (QID) | INTRAMUSCULAR | Status: DC
  Administered 2019-03-04 (×3): 0.4 mg via INTRAVENOUS
  Filled 2019-03-04 (×3): qty 2

## 2019-03-04 MED ORDER — MORPHINE SULFATE (PF) 2 MG/ML IV SOLN
1.0000 mg | INTRAVENOUS | Status: DC | PRN
  Administered 2019-03-04: 1 mg via INTRAVENOUS
  Administered 2019-03-06: 2 mg via INTRAVENOUS

## 2019-03-04 MED ORDER — LORAZEPAM 2 MG/ML IJ SOLN
1.0000 mg | INTRAMUSCULAR | Status: DC | PRN
  Administered 2019-03-04: 1 mg via INTRAVENOUS
  Filled 2019-03-04: qty 1

## 2019-03-04 NOTE — Progress Notes (Signed)
Pt arrived to [facility] from [facility], palliative Care.  Pt appears agitated, trying to pull at condom cath.  Pt has rectal tube in, morphine drip at 1.  Contacted Dr. Domingo Cocking for orders.
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
## 2019-03-04 NOTE — Progress Notes (Signed)
Chaplain responded to spiritual consult. Patient transitioning to comfort care. Consulted with nurse. Family member Crystal did not want to come in.  Wife died a month ago.  Bedside, chaplain offered words of comfort, prayer and hymn. Will continue to be available. Rev. Tamsen Snider Pager [phone]

## 2019-03-04 NOTE — Progress Notes (Signed)
Pt appears more comfortable now in bed, mittens removed, cortrak was removed earlier.  Pt positioned for comfort with pillows and neck pillow.  Morphine drip is at 2.

## 2019-03-04 NOTE — Progress Notes (Signed)
Nutrition Brief Note  Chart reviewed. Pt now transitioning to comfort care.  No further nutrition interventions warranted at this time.  Please re-consult as needed.   Fabien Travelstead RD, LDN, CNSC [phone] Pager [phone] After Hours Pager    

## 2019-03-04 NOTE — Progress Notes (Signed)
Patient ID: Francis Mendoza, male   DOB: [birth year], 73 y.o.   MRN: [id number]    Subjective: Non-verbal  Objective: Vital signs in last 24 hours: Temp:  [98.3 F (36.8 C)-98.5 F (36.9 C)] 98.5 F (36.9 C) (06/08 0356) Pulse Rate:  [38-106] 78 (06/08 0600) Resp:  [14-27] 22 (06/08 0600) BP: (127-186)/(62-115) 151/74 (06/08 0600) SpO2:  [94 %-98 %] 95 % (06/08 0600) Last BM Date: 03/02/19  Intake/Output from previous day: 06/07 0701 - 06/08 0700 In: 2470 [NG/GT:2470] Out: 1500 [Urine:1250; Stool:250] Intake/Output this shift: No intake/output data recorded.  General appearance: no distress Nose: Cortrak Resp: a lot of upper airway congestion, few rhonchi Cardio: regular rate and rhythm GI: soft, non-tender; bowel sounds normal; no masses,  no organomegaly Neurologic: Mental status: not F/C, localizes to pain  Lab Results: CBC  Recent Labs    03/03/19 0246  WBC 15.6*  HGB 10.7*  HCT 32.4*  PLT 528*   BMET Recent Labs    03/03/19 0246  NA 137  K 4.0  CL 105  CO2 25  GLUCOSE 141*  BUN 21  CREATININE 0.38*  CALCIUM 8.2*   PT/INR No results for input(s): LABPROT, INR in the last 72 hours. ABG No results for input(s): PHART, HCO3 in the last 72 hours.  Invalid input(s): PCO2, PO2  Studies/Results: No results found.  Anti-infectives: Anti-infectives (From admission, onward)   Start     Dose/Rate Route Frequency Ordered Stop   02/26/19 1400  cefTRIAXone (ROCEPHIN) 2 g in sodium chloride 0.9 % 100 mL IVPB     2 g 200 mL/hr over 30 Minutes Intravenous Every 24 hours 02/26/19 0908 03/01/19 1529   02/24/19 0200  vancomycin (VANCOCIN) IVPB 1000 mg/200 mL premix  Status:  Discontinued     1,000 mg 200 mL/hr over 60 Minutes Intravenous Every 12 hours 02/23/19 1224 02/26/19 0908   02/23/19 1400  vancomycin (VANCOCIN) 1,500 mg in sodium chloride 0.9 % 500 mL IVPB     1,500 mg 250 mL/hr over 120 Minutes Intravenous  Once 02/23/19 1224 02/23/19 1803   02/23/19  1300  ceFEPIme (MAXIPIME) 2 g in sodium chloride 0.9 % 100 mL IVPB  Status:  Discontinued     2 g 200 mL/hr over 30 Minutes Intravenous Every 8 hours 02/23/19 1224 02/26/19 0908      Assessment/Plan: 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 Zada Finders S.O. Acute hypoxic respiratory failure - one way extubation 6/4, [place] O2, maintaining sats but increasing secretions ABL anemia  ID - completed ABX for resp CX serratia and strep  C spine cleared Hyperglycemia - BS ok, cont SSI FEN -  TF via Cortrak VTE - PAS Dispo - ICU, DNR, DNI. Palliative consult today.    LOS: 13 days    Georganna Skeans, MD, MPH, [facility]: [phone]  03/04/2019

## 2019-03-05 ENCOUNTER — Encounter (HOSPITAL_COMMUNITY): Payer: Self-pay

## 2019-03-05 ENCOUNTER — Other Ambulatory Visit: Payer: Self-pay

## 2019-03-05 DIAGNOSIS — R451 Restlessness and agitation: Secondary | ICD-10-CM

## 2019-03-05 MED ORDER — LORAZEPAM 2 MG/ML IJ SOLN
1.0000 mg | Freq: Four times a day (QID) | INTRAMUSCULAR | Status: DC
  Administered 2019-03-05 – 2019-03-06 (×4): 1 mg via INTRAVENOUS
  Filled 2019-03-05 (×4): qty 1

## 2019-03-05 NOTE — Progress Notes (Signed)
Patient ID: Francis Mendoza, male   DOB: Feb 16, 1946, 73 y.o.   MRN: [id number]  This NP visited patient at the bedside as a follow up for palliative medicine needs and emotional support.  Patient appears agitated and generally uncomfortable.  Mouth is dry.   We will add scheduled Ativan,  discussed with nursing titration of morphine drip.  DC Robinul and sips and chips as tolerated with known risk of aspiration.  Focus of care is comfort and dignity.  Total time spent on the unit was 15 minutes  PMT will continue to support holistically  Greater than 50% of the time was spent in counseling and coordination of care  Wadie Lessen NP  Palliative Medicine Team Team Phone # [phone] Pager [phone]

## 2019-03-05 NOTE — Progress Notes (Signed)
[facility] Progress Note     Subjective: CC: non verbal Patient resting in bed. Morphine drip at 2.   Objective: Vital signs in last 24 hours: Temp:  [97.8 F (36.6 C)-98.9 F (37.2 C)] 98.9 F (37.2 C) (06/09 0406) Pulse Rate:  [35-79] 67 (06/09 0406) Resp:  [16-21] 19 (06/09 0406) BP: (103-168)/(66-117) 168/86 (06/09 0406) SpO2:  [94 %-100 %] 94 % (06/09 0406) Last BM Date: 03/02/19  Intake/Output from previous day: 06/08 0701 - 06/09 0700 In: 397.8 [I.V.:177.8; NG/GT:220] Out: 1300 [Urine:1100; Stool:200] Intake/Output this shift: No intake/output data recorded.  PE: Gen:  Sleeping, not agitated Card:  tachycardic Pulm:  increased effort with accessory muscle usage, rales bilaterally Abd: Soft, non-tender, non-distended, +BS Skin: warm and dry, no rashes    Lab Results:  Recent Labs    03/03/19 0246 03/04/19 0852  WBC 15.6* 15.1*  HGB 10.7* 11.5*  HCT 32.4* 35.4*  PLT 528* 605*   BMET Recent Labs    03/03/19 0246 03/04/19 0852  NA 137 137  K 4.0 4.2  CL 105 102  CO2 25 21*  GLUCOSE 141* 129*  BUN 21 22  CREATININE 0.38* 0.50*  CALCIUM 8.2* 8.8*   PT/INR No results for input(s): LABPROT, INR in the last 72 hours. CMP     Component Value Date/Time   NA 137 03/04/2019 0852   K 4.2 03/04/2019 0852   CL 102 03/04/2019 0852   CO2 21 (L) 03/04/2019 0852   GLUCOSE 129 (H) 03/04/2019 0852   BUN 22 03/04/2019 0852   CREATININE 0.50 (L) 03/04/2019 0852   CALCIUM 8.8 (L) 03/04/2019 0852   PROT 6.7 02/19/2019 0100   ALBUMIN 3.6 02/19/2019 0100   AST 25 02/19/2019 0100   ALT 16 02/19/2019 0100   ALKPHOS 60 02/19/2019 0100   BILITOT 0.6 02/19/2019 0100   GFRNONAA >60 03/04/2019 0852   GFRAA >60 03/04/2019 0852   Lipase  No results found for: LIPASE     Studies/Results: No results found.  Anti-infectives: Anti-infectives (From admission, onward)   Start     Dose/Rate Route Frequency Ordered Stop   02/26/19 1400  cefTRIAXone  (ROCEPHIN) 2 g in sodium chloride 0.9 % 100 mL IVPB     2 g 200 mL/hr over 30 Minutes Intravenous Every 24 hours 02/26/19 0908 03/01/19 1529   02/24/19 0200  vancomycin (VANCOCIN) IVPB 1000 mg/200 mL premix  Status:  Discontinued     1,000 mg 200 mL/hr over 60 Minutes Intravenous Every 12 hours 02/23/19 1224 02/26/19 0908   02/23/19 1400  vancomycin (VANCOCIN) 1,500 mg in sodium chloride 0.9 % 500 mL IVPB     1,500 mg 250 mL/hr over 120 Minutes Intravenous  Once 02/23/19 1224 02/23/19 1803   02/23/19 1300  ceFEPIme (MAXIPIME) 2 g in sodium chloride 0.9 % 100 mL IVPB  Status:  Discontinued     2 g 200 mL/hr over 30 Minutes Intravenous Every 8 hours 02/23/19 1224 02/26/19 0908       Assessment/Plan 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

## 2019-03-27 NOTE — Progress Notes (Signed)
Wasted about 30ml morphine drip witnessed by Academic librarian, rn

## 2019-03-27 NOTE — Progress Notes (Signed)
Called by staff to check on patient , noted patient took his last breath at 0815, another nurse verified assessment,no breathing and heartbeat noted afterwards. MD on call made aware. Pronounce death at 43. Will notify family.

## 2019-03-27 NOTE — Progress Notes (Signed)
Medical Examiner, RICK MIller made aware of the case.

## 2019-03-27 NOTE — Progress Notes (Signed)
  [facility] Progress Note     Subjective: CC-  Tachy, hypotensive, and febrile this AM. Labored breathing. Does not appear to be in pain.  Objective: Vital signs in last 24 hours: Temp:  [101.8 F (38.8 C)] 101.8 F (38.8 C) (06/10 0619) Pulse Rate:  [130] 130 (06/10 0619) Resp:  [23] 23 (06/10 0619) BP: (93)/(53) 93/53 (06/10 0619) SpO2:  [80 %] 80 % (06/10 0619) Last BM Date: 03/04/19  Intake/Output from previous day: 06/09 0701 - 06/10 0700 In: 275 [I.V.:275] Out: 925 [Urine:925] Intake/Output this shift: No intake/output data recorded.  PE: Gen:  Sleeping, not agitated Card:  tachycardic Pulm:  increased effort with accessory muscle usage, rales bilaterally Abd: Soft, non-tender, non-distended, +BS Skin: warm and dry, no rashes  Msk: calves soft and nontender  Lab Results:  Recent Labs    03/04/19 0852  WBC 15.1*  HGB 11.5*  HCT 35.4*  PLT 605*   BMET Recent Labs    03/04/19 0852  NA 137  K 4.2  CL 102  CO2 21*  GLUCOSE 129*  BUN 22  CREATININE 0.50*  CALCIUM 8.8*   PT/INR No results for input(s): LABPROT, INR in the last 72 hours. CMP     Component Value Date/Time   NA 137 03/04/2019 0852   K 4.2 03/04/2019 0852   CL 102 03/04/2019 0852   CO2 21 (L) 03/04/2019 0852   GLUCOSE 129 (H) 03/04/2019 0852   BUN 22 03/04/2019 0852   CREATININE 0.50 (L) 03/04/2019 0852   CALCIUM 8.8 (L) 03/04/2019 0852   PROT 6.7 02/19/2019 0100   ALBUMIN 3.6 02/19/2019 0100   AST 25 02/19/2019 0100   ALT 16 02/19/2019 0100   ALKPHOS 60 02/19/2019 0100   BILITOT 0.6 02/19/2019 0100   GFRNONAA >60 03/04/2019 0852   GFRAA >60 03/04/2019 0852   Lipase  No results found for: LIPASE     Studies/Results: No results found.  Anti-infectives: Anti-infectives (From admission, onward)   Start     Dose/Rate Route Frequency Ordered Stop   02/26/19 1400  cefTRIAXone (ROCEPHIN) 2 g in sodium chloride 0.9 % 100 mL IVPB     2 g 200 mL/hr over 30  Minutes Intravenous Every 24 hours 02/26/19 0908 03/01/19 1529   02/24/19 0200  vancomycin (VANCOCIN) IVPB 1000 mg/200 mL premix  Status:  Discontinued     1,000 mg 200 mL/hr over 60 Minutes Intravenous Every 12 hours 02/23/19 1224 02/26/19 0908   02/23/19 1400  vancomycin (VANCOCIN) 1,500 mg in sodium chloride 0.9 % 500 mL IVPB     1,500 mg 250 mL/hr over 120 Minutes Intravenous  Once 02/23/19 1224 02/23/19 1803   02/23/19 1300  ceFEPIme (MAXIPIME) 2 g in sodium chloride 0.9 % 100 mL IVPB  Status:  Discontinued     2 g 200 mL/hr over 30 Minutes Intravenous Every 8 hours 02/23/19 1224 02/26/19 0908       Assessment/Plan 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 Appreciate palliative assistance.   LOS: 15 days    Wellington Hampshire , [facility] 03/25/2019, 8:09 AM Pager: [phone] Mon-Thurs 7:00 am-4:30 pm Fri 7:00 am -11:30 AM Sat-Sun 7:00 am-11:30 am

## 2019-03-27 NOTE — Death Summary Note (Signed)
Physician Death Summary  Patient ID: Francis Mendoza MRN: [id number] DOB/AGE: [birth year] 73 y.o.  Admit date: 03/09/2019 Time of death: 2019-03-25 at 0815  Admitting Diagnoses Found Down Subdural hematoma Subarachnoid hemorrhage Temporal intracerebral contusion Parietal skull fracture Pneumocephalus Acute hypoxic VDRF ABL anemia  Consultants Neurosurgery Palliative care  Procedures None   HPI: This is a 73 year old male who was found down in the road with obvious head trauma. The patient was brought to [facility] and was confused and combative. He was unable to provide any history. He was intubated for airway protection and to allow work-up. He was then transferred to [facility] because of neurosurgical trauma.  He was given Keppra at [place], although no seizure activity was reported. Patient scanned and found to have SDH/SAH/ICC and skull fracture. Patient was admitted to ICU.   Hospital Course: Neurosurgery consulted for TBI and recommended follow up head CT. This was done 5/26 and showed worsening L temporal lobe infarct. Head CT repeated again 5/29 and showed increase in L temporal lobe infarct but some improvement in intracerebral contusions. Patient febrile 5/30 and started on empiric therapy for suspected pneumonia, pan-cultures sent. He was found to have Strep and Serratia in his respiratory aspirate.  He completed treatment for this.  His family member was contacted on 6/4 due to minimal neurologic function, but weaning on vent, and the decision was made for a one-way extubation that day as she did not want a trach and peg for him. Cortrak placed for tube feeding 6/5. Palliative care consulted 03-23-2023 and patient transitioned to full palliative care Mar 23, 2019. He passed away peacefully at [facility] 03/25/2019.    Signed: Wells Guiles , [facility] 03/25/2019, 10:10 AM Pager: [phone]

## 2019-03-27 NOTE — Progress Notes (Signed)
[employer] made aware. Body is suitable for tissue donation with referral no. [id number] by Alesia Morin.

## 2019-03-27 DEATH — deceased

## 2020-09-12 IMAGING — DX PORTABLE ABDOMEN - 1 VIEW
1 series · 1 of 1 positions shown · non-contrast
Comparison: None.

CLINICAL DATA: Orogastric tube placement.

EXAM:
PORTABLE ABDOMEN - 1 VIEW

[abdomen kub]
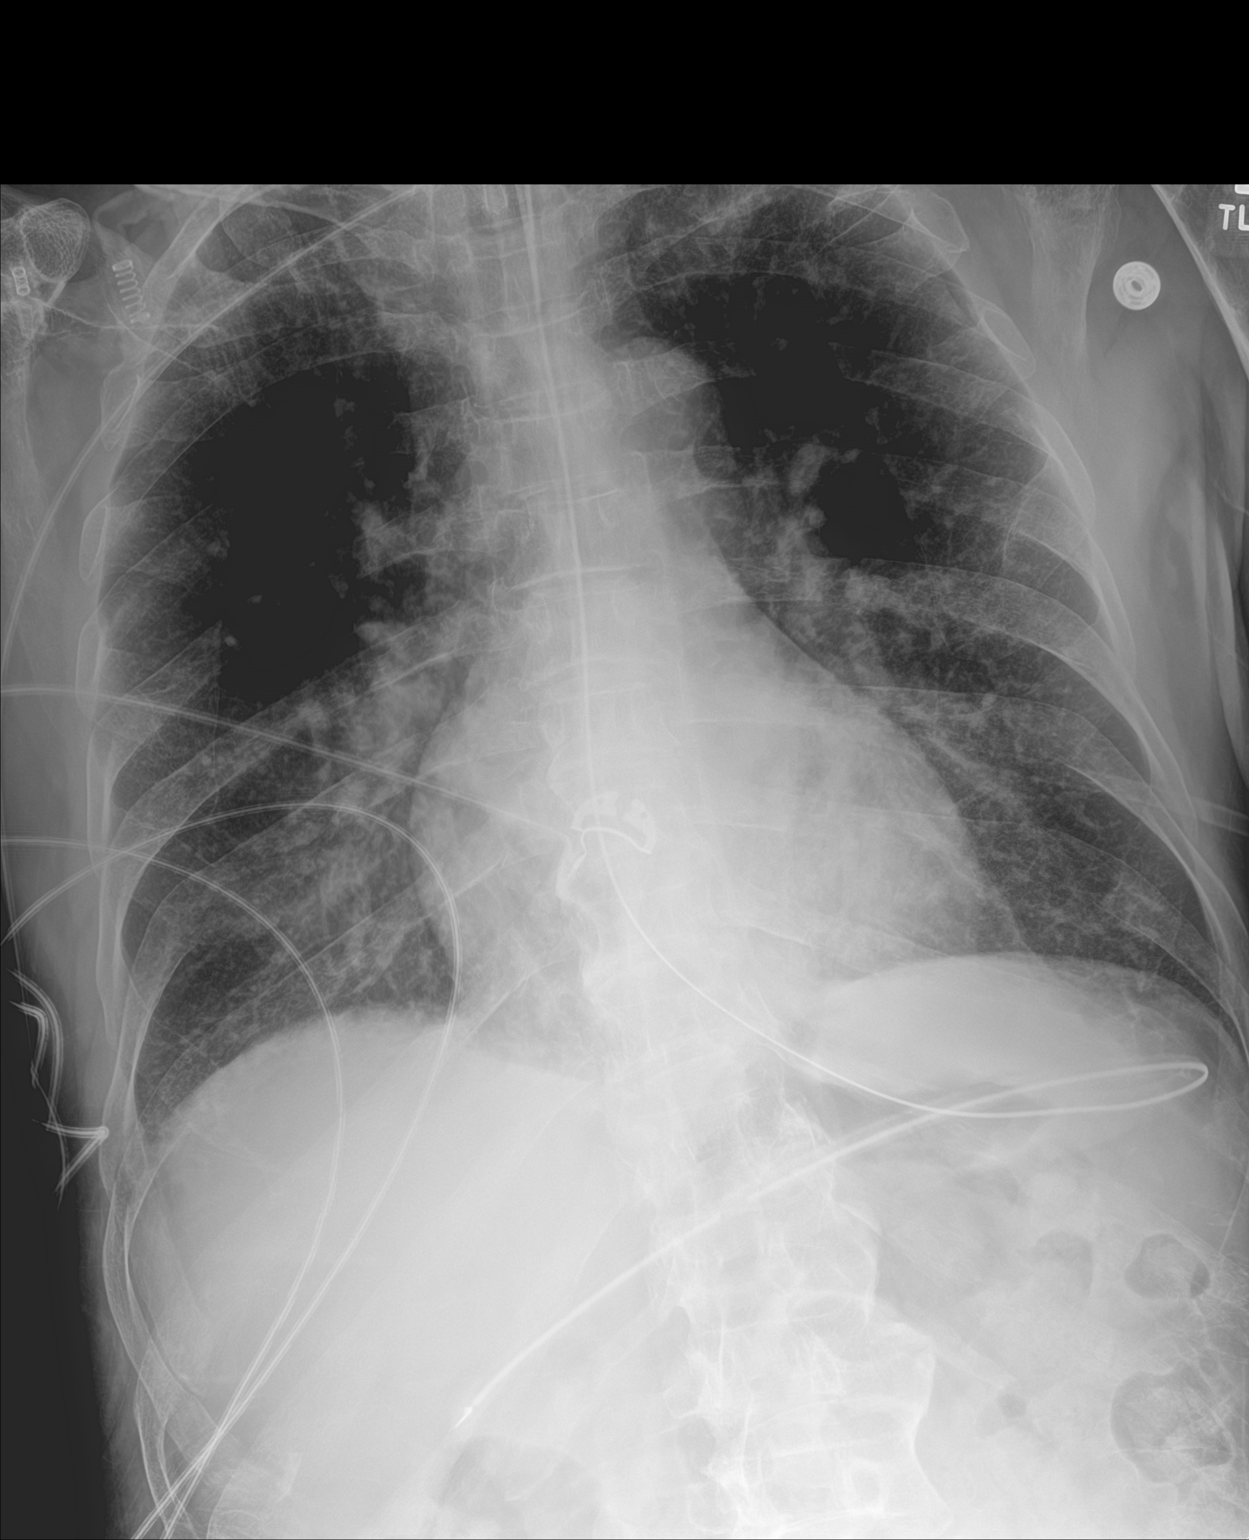

[1 of 1 positions shown; findings below may reference images not displayed]

FINDINGS: The bowel gas pattern is normal. Distal tip of enteric tube is seen
in expected position of distal stomach or proximal duodenum. No
radio-opaque calculi or other significant radiographic abnormality
are seen.
IMPRESSION: Distal tip of enteric tube is seen in expected position of distal
stomach or proximal duodenum.

## 2020-09-13 IMAGING — DX PORTABLE CHEST - 1 VIEW
1 series · 1 of 1 positions shown · non-contrast
Comparison: Radiograph February 18, 2019.

CLINICAL DATA: Respiratory failure.

EXAM:
PORTABLE CHEST 1 VIEW

[chest ap]
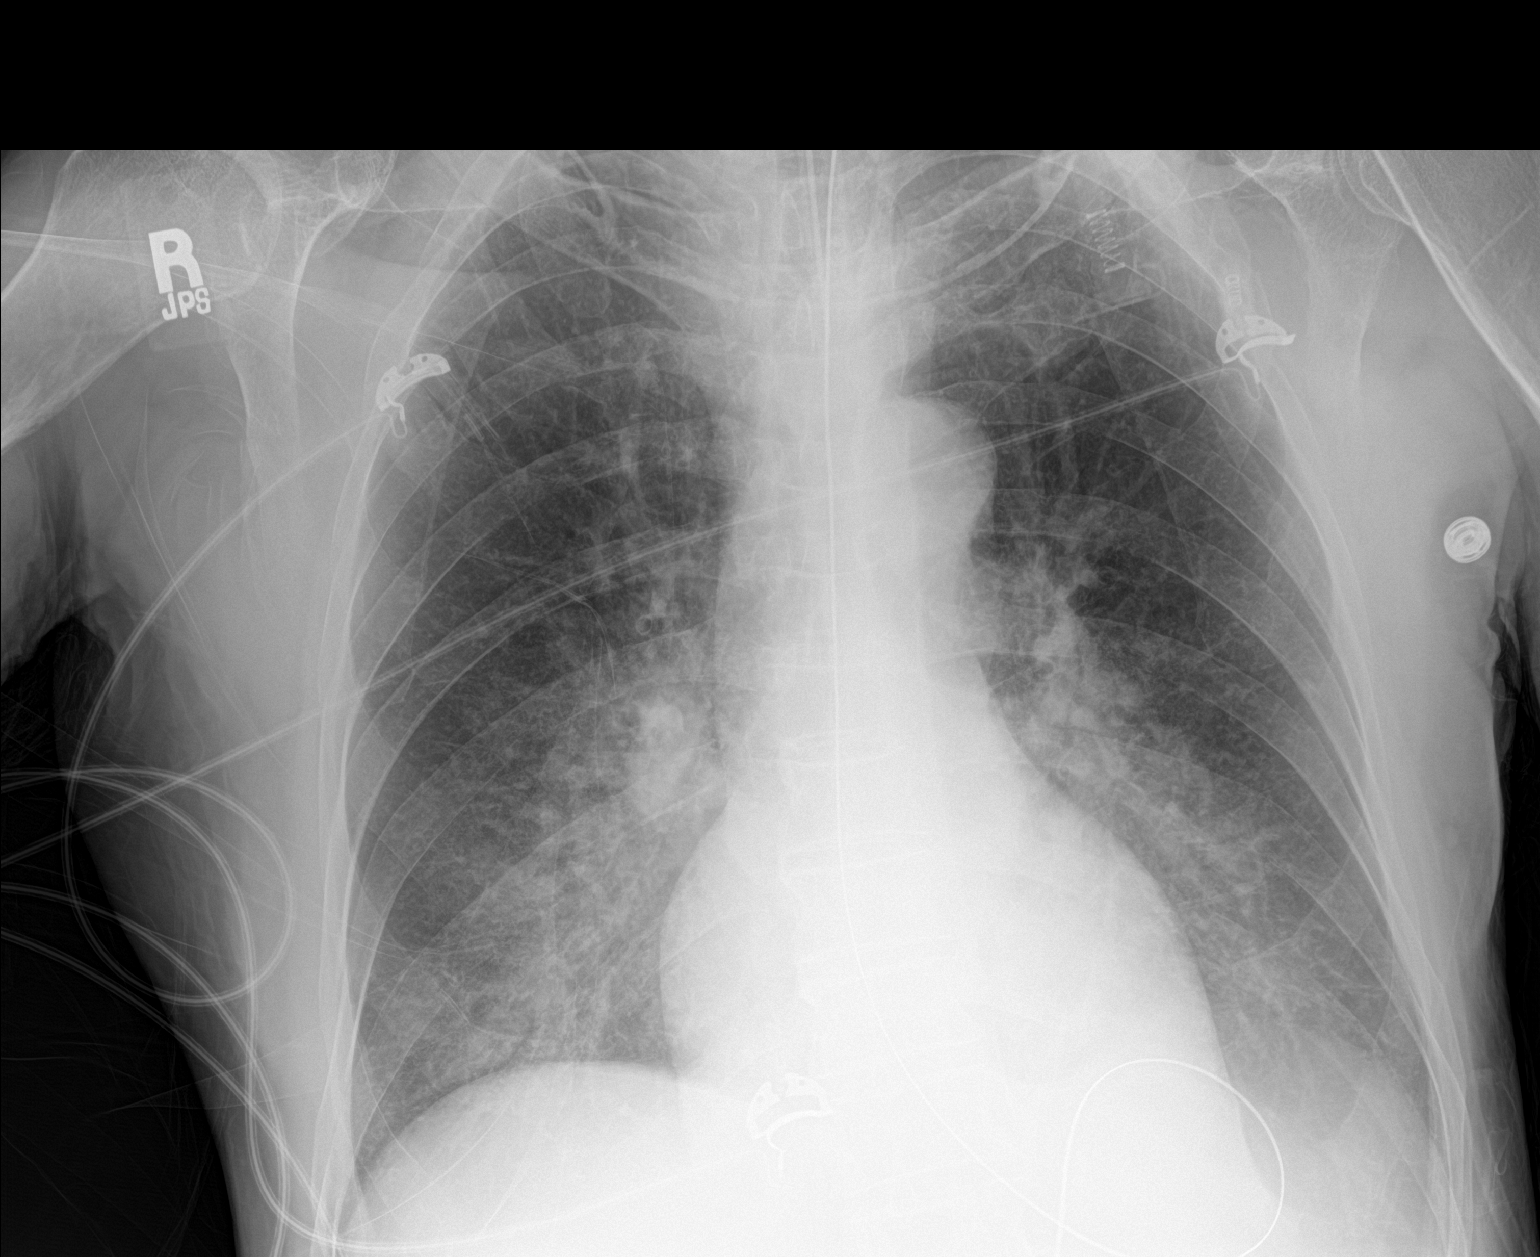

[1 of 1 positions shown; findings below may reference images not displayed]

FINDINGS: Stable cardiomediastinal silhouette. Endotracheal and nasogastric
tubes are unchanged in position. No pneumothorax is noted. No
significant pleural effusion is noted. Increased bibasilar opacities
are noted suggesting worsening edema, atelectasis or infiltrates.
Bony thorax is unremarkable.
IMPRESSION: Stable support apparatus. Increased bibasilar opacities as described
above.

## 2020-09-15 IMAGING — DX PORTABLE CHEST - 1 VIEW
1 series · 1 of 1 positions shown · non-contrast
Comparison: 02/21/2019

CLINICAL DATA: Respiratory failure

EXAM:
PORTABLE CHEST 1 VIEW

[chest ap]
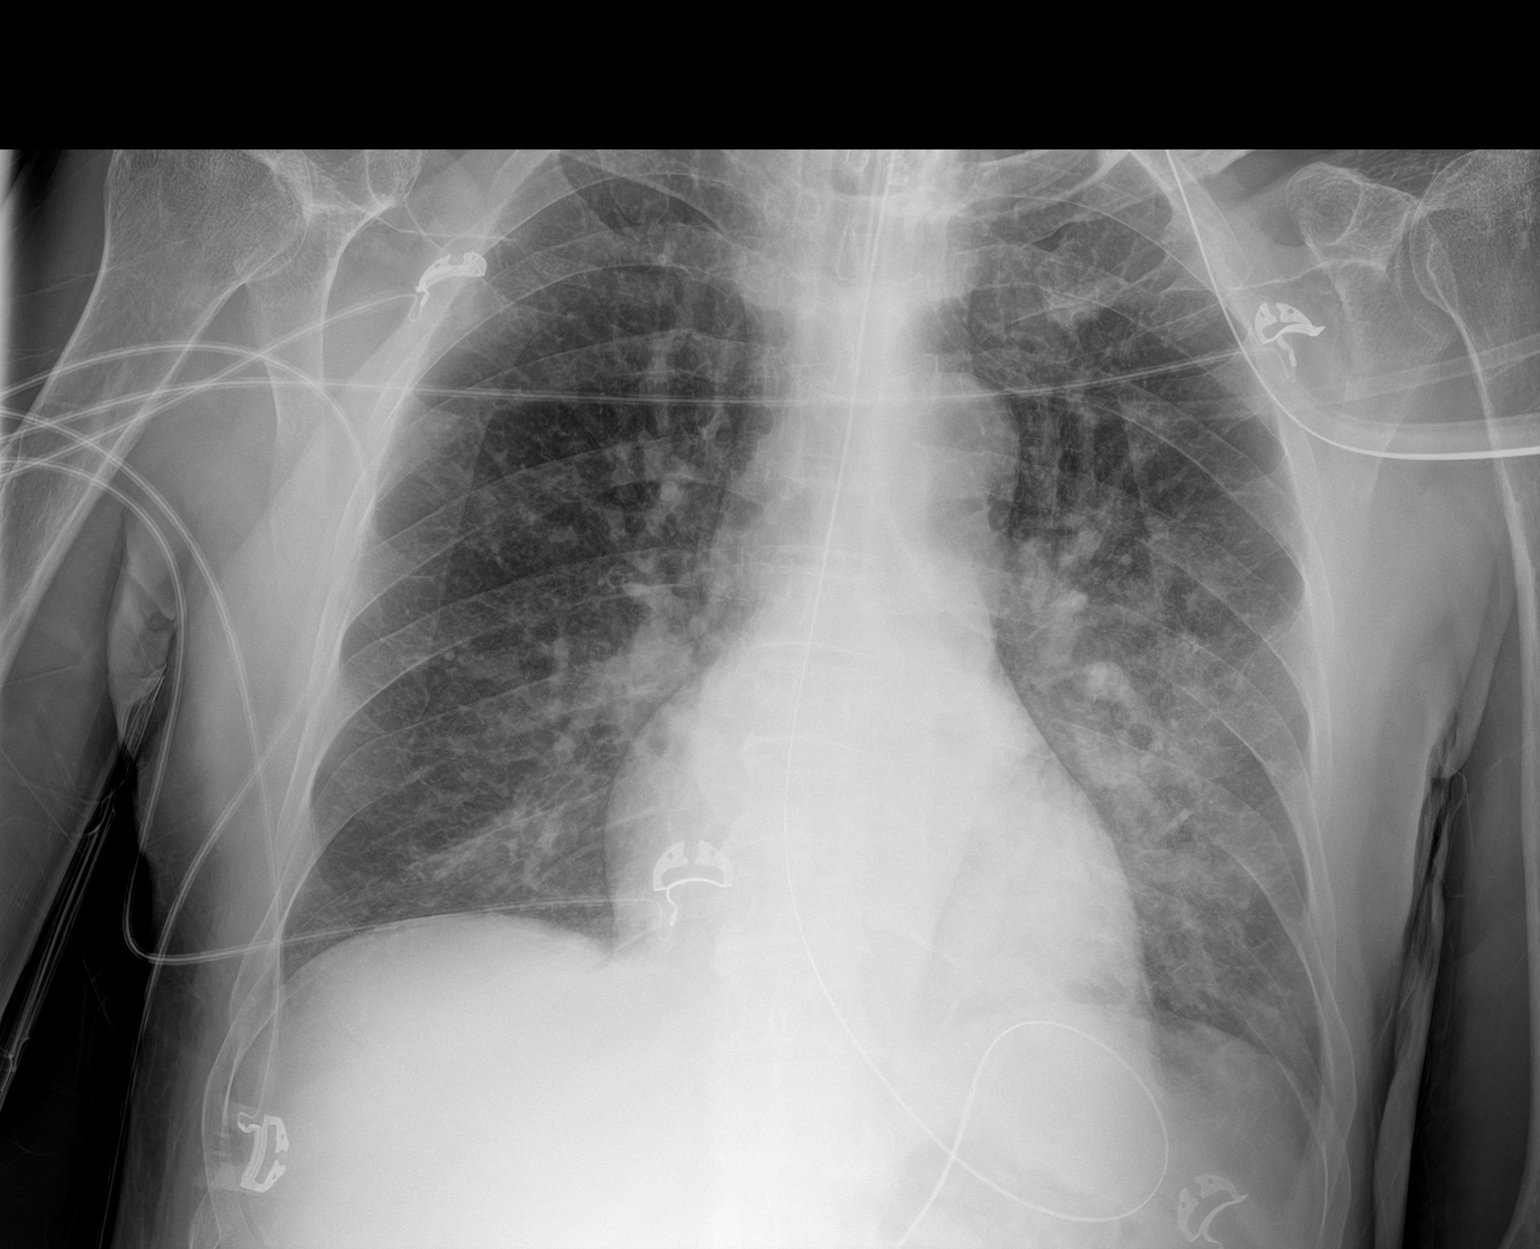

[1 of 1 positions shown; findings below may reference images not displayed]

FINDINGS: Endotracheal tube terminates 7 cm above the carina.

Increased interstitial markings. Mild patchy left perihilar
opacities may reflect asymmetric interstitial edema versus infection
(favored). Possible small left pleural effusion. No pneumothorax.

The heart is normal in size.

Enteric tube courses into the stomach.
IMPRESSION: Increased interstitial markings with mild patchy left perihilar
opacities, favoring infection over asymmetric interstitial edema.

Endotracheal tube terminates 7 cm above the carina.

## 2020-09-15 IMAGING — CT CT HEAD WITHOUT CONTRAST
4 series · 16 of 47 positions shown, 18 images · non-contrast
Comparison: 02/22/2019.  02/19/2019.

CLINICAL DATA: Right arm weakness.  Head injury 02/18/2019

EXAM:
CT HEAD WITHOUT CONTRAST
TECHNIQUE: Contiguous axial images were obtained from the base of the skull
through the vertex without intravenous contrast.

[Series 3: head wo · axial · 0.46mm/px · z∈[-184,-60]mm · 7 of 35 slices shown, 9 images]
[im 5/35  brain]
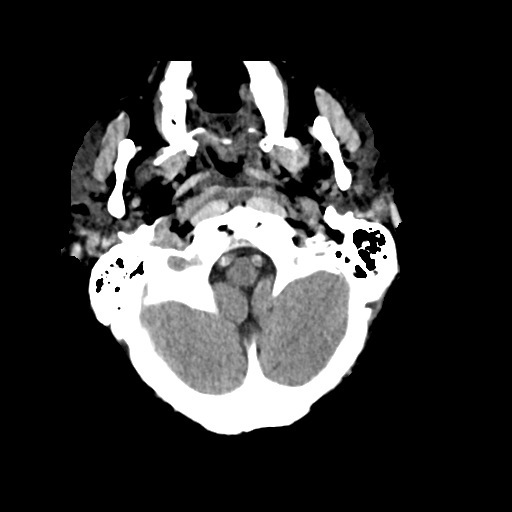
[im 5/35  bone]
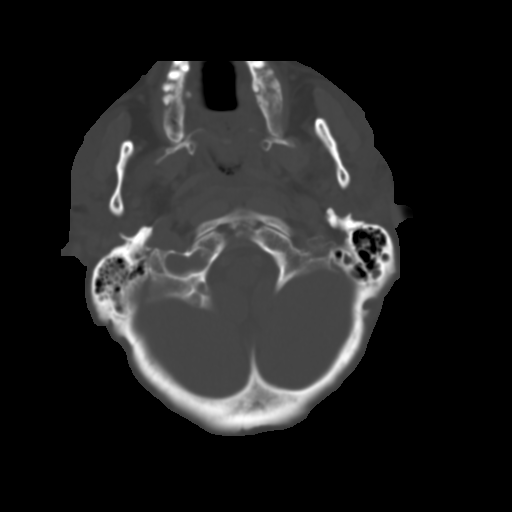
[im 9/35  brain]
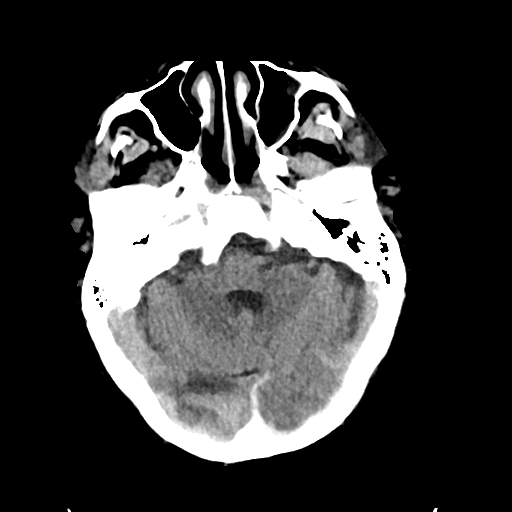
[im 13/35  brain]
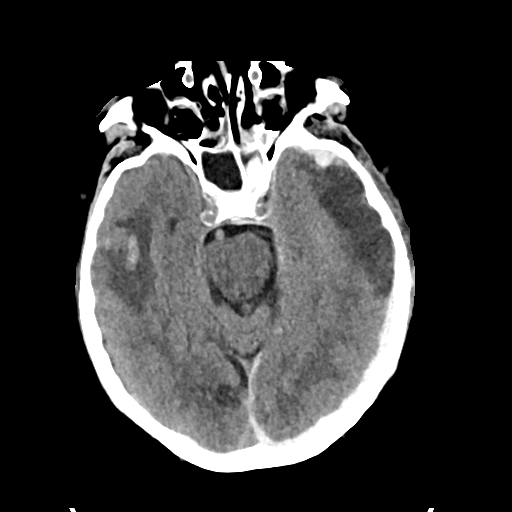
[im 18/35  brain]
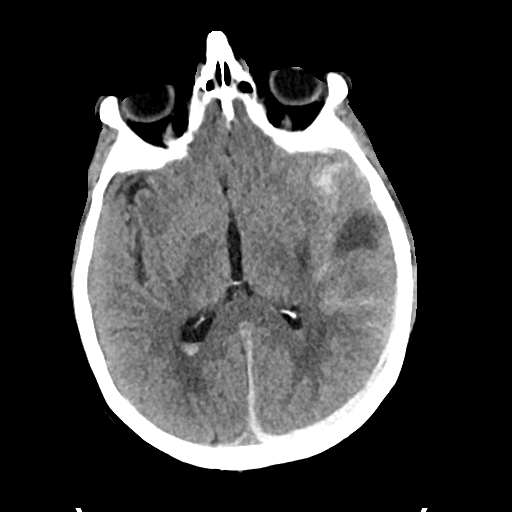
[im 22/35  brain]
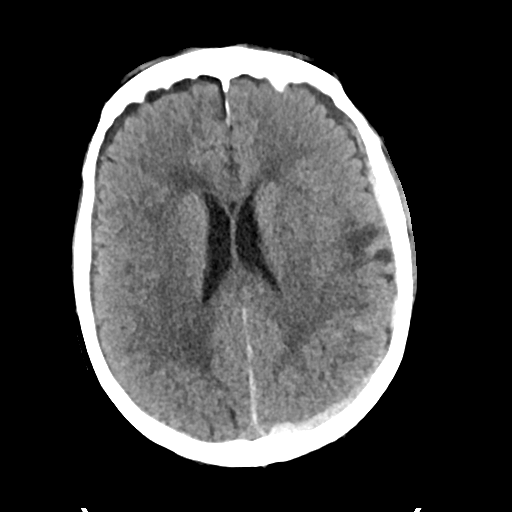
[im 22/35  bone]
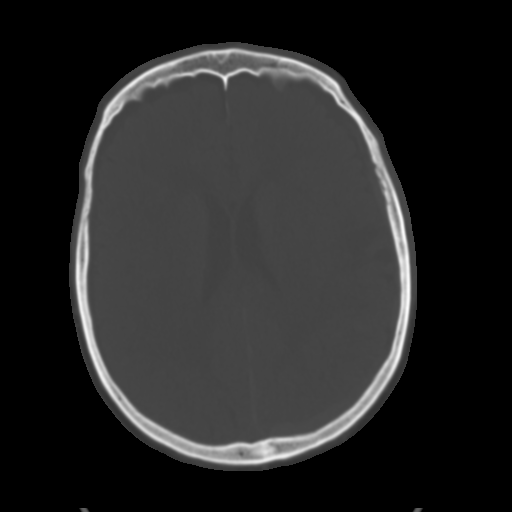
[im 26/35  brain]
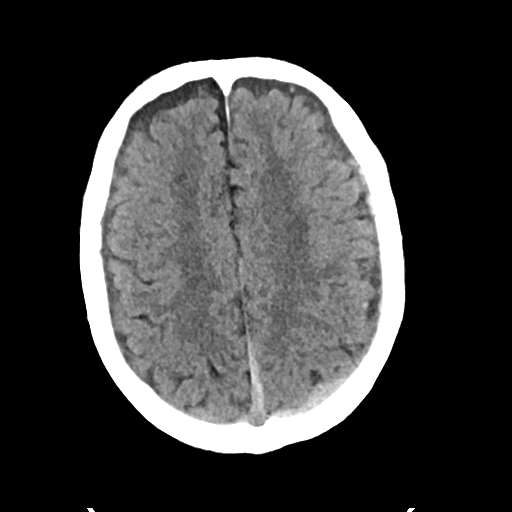
[im 30/35  brain]
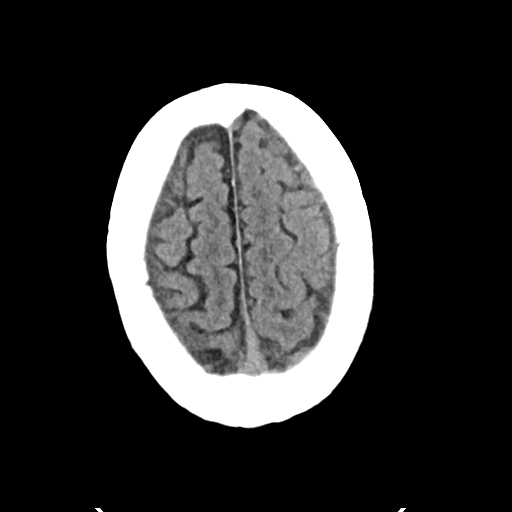

[Series 4: head bone · axial · 0.46mm/px · z∈[-188,-152]mm · 3 of 88 slices shown]
[im 9/88  bone]
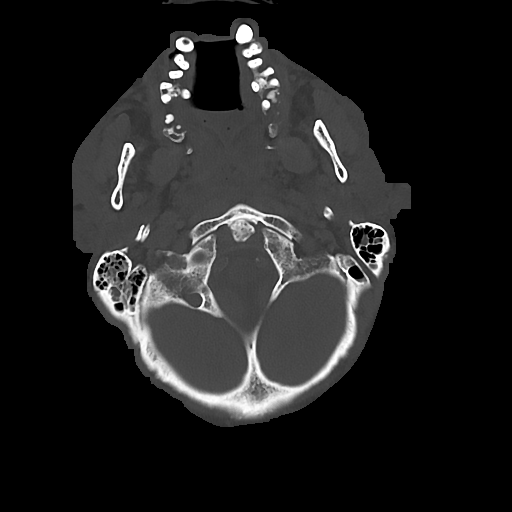
[im 18/88  bone]
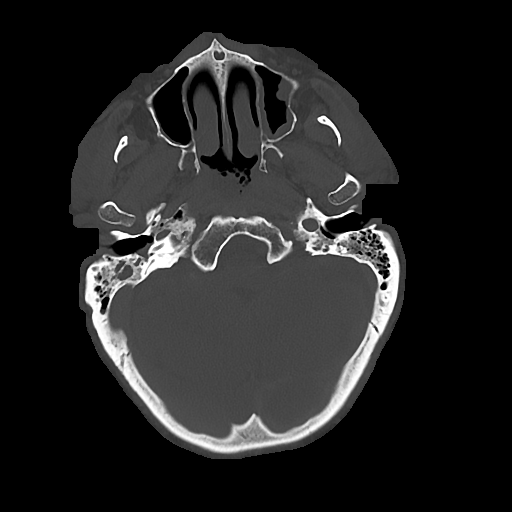
[im 27/88  bone]
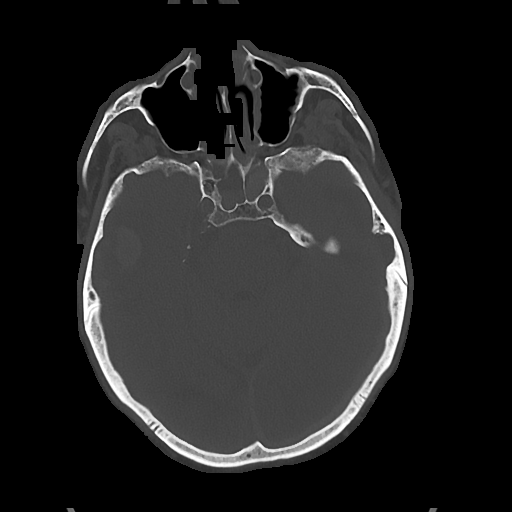

[Series 5: cor soft · coronal · 0.39mm/px · 3 of 69 slices shown]
[im 23/69  brain]
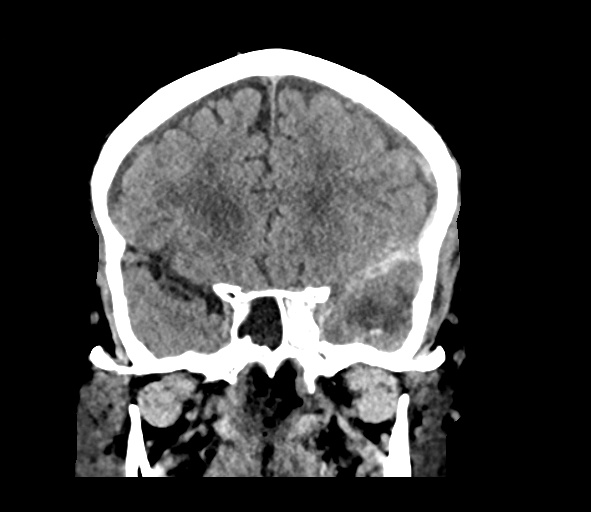
[im 31/69  brain]
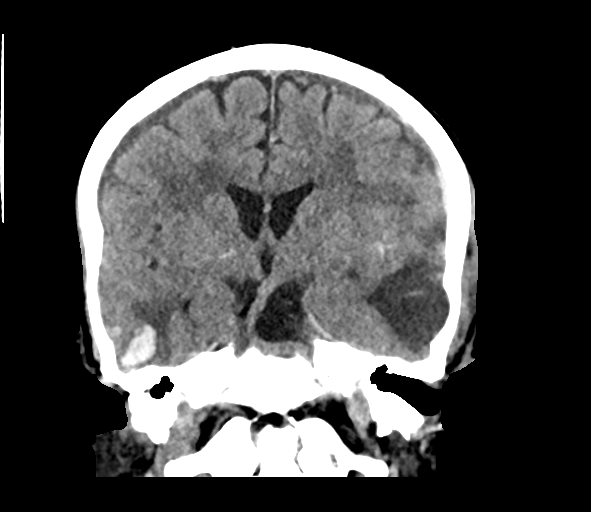
[im 38/69  brain]
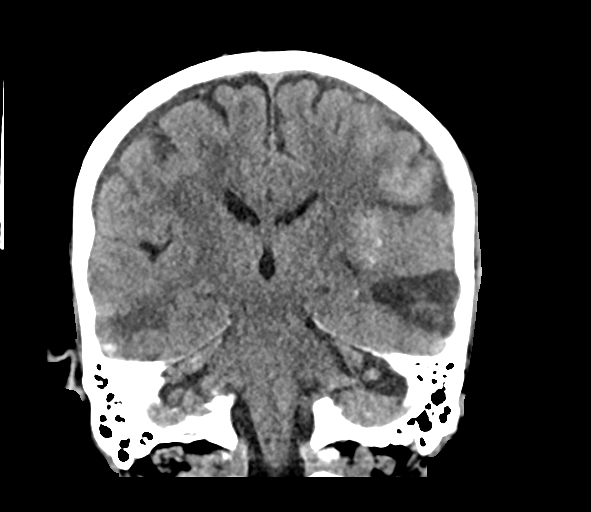

[Series 6: sag soft · sagittal · 0.41mm/px · 3 of 58 slices shown]
[im 20/58  brain]
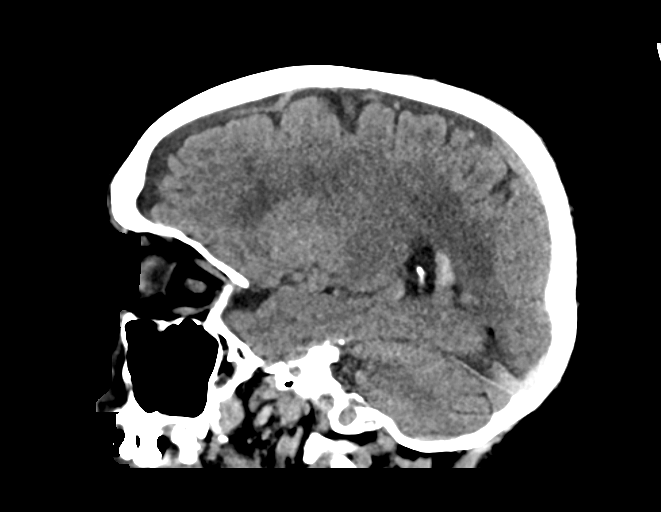
[im 29/58  brain]
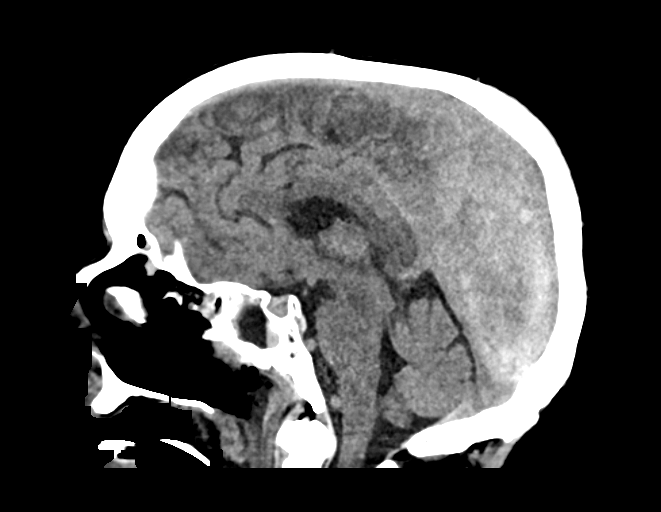
[im 39/58  brain]
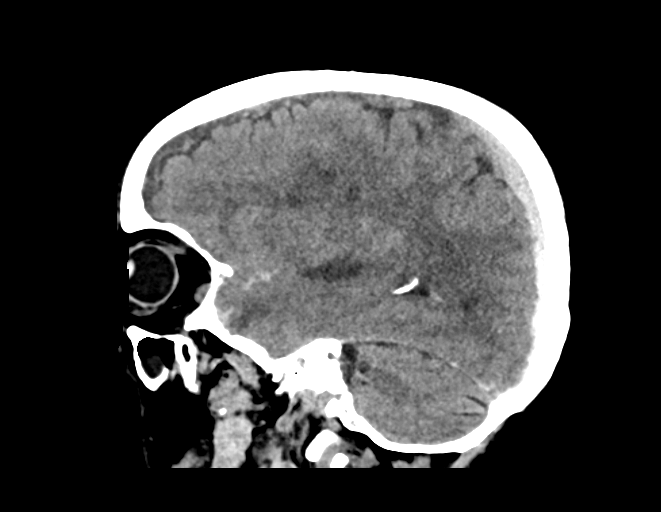

[16 of 47 positions shown; findings below may reference images not displayed]

FINDINGS: Brain: No injury of the brainstem or cerebellum. Hemorrhagic
contusion in the right occipital lobe is showing decreasing density
of the blood. Regional edema appears stable. Hemorrhagic contusion
of the right temporal lobe does not show any increased hemorrhage.
Regional vasogenic edema appears stable. Subdural hematoma along the
left side of the falx and the left convexity is no larger, maximal
thickness 5 mm. Infarction of the anterior and lateral left temporal
lobe appears the same with low-density but no subsequent hemorrhage.
Subarachnoid and intraventricular blood have not increased.
Ventricular size is stable.

Vascular: No primary vascular finding.

Skull: Left-sided skull fractures as seen previously.

Sinuses/Orbits: Sinus opacification in the ethmoid and sphenoid
sinuses. Asymmetric mastoid fluid on the right. No skull base or
temporal bone fracture is identified however.

Other: None
IMPRESSION: No new or worsening finding. Focal infarction in the anterior and
lateral left temporal lobe without subsequent hemorrhage.

Intraparenchymal, subdural, subarachnoid and intraventricular blood
has not increased or worsened. No increased edema or mass effect.
